# Patient Record
Sex: Female | Born: 1950 | Race: White | Hispanic: No | Smoking: Never smoker
Health system: Southern US, Community
[De-identification: ages and names within clinical notes are randomized; demographics above are authoritative.]

## PROBLEM LIST (undated history)

## (undated) DIAGNOSIS — H35033 Hypertensive retinopathy, bilateral: Secondary | ICD-10-CM

## (undated) DIAGNOSIS — S065X9A Traumatic subdural hemorrhage with loss of consciousness of unspecified duration, initial encounter: Secondary | ICD-10-CM

## (undated) DIAGNOSIS — G4733 Obstructive sleep apnea (adult) (pediatric): Secondary | ICD-10-CM

## (undated) DIAGNOSIS — M1710 Unilateral primary osteoarthritis, unspecified knee: Secondary | ICD-10-CM

## (undated) DIAGNOSIS — I10 Essential (primary) hypertension: Secondary | ICD-10-CM

## (undated) DIAGNOSIS — H524 Presbyopia: Secondary | ICD-10-CM

## (undated) DIAGNOSIS — H43811 Vitreous degeneration, right eye: Secondary | ICD-10-CM

## (undated) DIAGNOSIS — R609 Edema, unspecified: Secondary | ICD-10-CM

## (undated) DIAGNOSIS — S065XAA Traumatic subdural hemorrhage with loss of consciousness status unknown, initial encounter: Secondary | ICD-10-CM

## (undated) DIAGNOSIS — M170 Bilateral primary osteoarthritis of knee: Secondary | ICD-10-CM

## (undated) DIAGNOSIS — H5213 Myopia, bilateral: Secondary | ICD-10-CM

## (undated) DIAGNOSIS — H25019 Cortical age-related cataract, unspecified eye: Secondary | ICD-10-CM

## (undated) DIAGNOSIS — H33109 Unspecified retinoschisis, unspecified eye: Secondary | ICD-10-CM

## (undated) HISTORY — DX: Unilateral primary osteoarthritis, unspecified knee: M17.10

## (undated) HISTORY — DX: Presbyopia: H52.4

## (undated) HISTORY — DX: Hypertensive retinopathy, bilateral: H35.033

## (undated) HISTORY — DX: Myopia, bilateral: H52.13

## (undated) HISTORY — DX: Unspecified retinoschisis, unspecified eye: H33.109

## (undated) HISTORY — DX: Obstructive sleep apnea (adult) (pediatric): G47.33

## (undated) HISTORY — DX: Cortical age-related cataract, unspecified eye: H25.019

## (undated) HISTORY — DX: Vitreous degeneration, right eye: H43.811

## (undated) HISTORY — PX: COMBINED LAPAROSCOPY W/ HYSTEROSCOPY: SUR299

## (undated) HISTORY — PX: OOPHORECTOMY: SHX86

## (undated) HISTORY — PX: HERNIA REPAIR: SHX51

## (undated) HISTORY — DX: Bilateral primary osteoarthritis of knee: M17.0

---

## 2004-10-06 ENCOUNTER — Ambulatory Visit (HOSPITAL_COMMUNITY): Admission: RE | Admit: 2004-10-06 | Discharge: 2004-10-06 | Payer: Self-pay | Admitting: Family Medicine

## 2005-11-10 ENCOUNTER — Ambulatory Visit: Payer: Self-pay | Admitting: Family Medicine

## 2005-11-18 ENCOUNTER — Ambulatory Visit: Payer: Self-pay | Admitting: Family Medicine

## 2006-05-10 ENCOUNTER — Ambulatory Visit: Payer: Self-pay

## 2006-12-08 ENCOUNTER — Ambulatory Visit: Payer: Self-pay

## 2006-12-14 ENCOUNTER — Ambulatory Visit: Payer: Self-pay

## 2007-05-22 ENCOUNTER — Ambulatory Visit: Payer: Self-pay | Admitting: General Surgery

## 2008-02-29 ENCOUNTER — Ambulatory Visit: Payer: Self-pay | Admitting: Urology

## 2008-06-13 ENCOUNTER — Ambulatory Visit: Payer: Self-pay | Admitting: Family Medicine

## 2008-06-19 ENCOUNTER — Ambulatory Visit: Payer: Self-pay | Admitting: Family Medicine

## 2008-09-13 HISTORY — PX: BREAST BIOPSY: SHX20

## 2008-09-13 HISTORY — PX: REPLACEMENT TOTAL KNEE: SUR1224

## 2008-12-25 ENCOUNTER — Ambulatory Visit: Payer: Self-pay

## 2009-01-28 ENCOUNTER — Ambulatory Visit: Payer: Self-pay | Admitting: General Surgery

## 2009-07-08 ENCOUNTER — Ambulatory Visit: Payer: Self-pay | Admitting: Family Medicine

## 2009-09-01 ENCOUNTER — Ambulatory Visit: Payer: Self-pay | Admitting: Ophthalmology

## 2010-07-29 ENCOUNTER — Ambulatory Visit: Payer: Self-pay | Admitting: Family Medicine

## 2013-01-24 DIAGNOSIS — H33109 Unspecified retinoschisis, unspecified eye: Secondary | ICD-10-CM

## 2013-01-24 HISTORY — DX: Unspecified retinoschisis, unspecified eye: H33.109

## 2016-01-19 DIAGNOSIS — M170 Bilateral primary osteoarthritis of knee: Secondary | ICD-10-CM

## 2016-01-19 DIAGNOSIS — Z96652 Presence of left artificial knee joint: Secondary | ICD-10-CM | POA: Insufficient documentation

## 2016-01-19 HISTORY — DX: Bilateral primary osteoarthritis of knee: M17.0

## 2016-02-10 DIAGNOSIS — Z6841 Body Mass Index (BMI) 40.0 and over, adult: Secondary | ICD-10-CM

## 2016-03-01 DIAGNOSIS — G4733 Obstructive sleep apnea (adult) (pediatric): Secondary | ICD-10-CM

## 2016-03-01 HISTORY — DX: Obstructive sleep apnea (adult) (pediatric): G47.33

## 2017-09-26 ENCOUNTER — Other Ambulatory Visit: Payer: Self-pay | Admitting: Family Medicine

## 2017-09-26 DIAGNOSIS — Z1231 Encounter for screening mammogram for malignant neoplasm of breast: Secondary | ICD-10-CM

## 2017-10-17 ENCOUNTER — Ambulatory Visit
Admission: RE | Admit: 2017-10-17 | Discharge: 2017-10-17 | Disposition: A | Discharge status: Home / Self Care | Payer: Medicare Other | Source: Ambulatory Visit | Attending: Family Medicine | Admitting: Family Medicine

## 2017-10-17 DIAGNOSIS — Z1231 Encounter for screening mammogram for malignant neoplasm of breast: Secondary | ICD-10-CM | POA: Insufficient documentation

## 2017-10-21 ENCOUNTER — Inpatient Hospital Stay
Admission: RE | Admit: 2017-10-21 | Discharge: 2017-10-21 | Disposition: A | Discharge status: Home / Self Care | Payer: Self-pay | Source: Ambulatory Visit | Attending: *Deleted | Admitting: *Deleted

## 2017-10-21 ENCOUNTER — Other Ambulatory Visit: Payer: Self-pay | Admitting: *Deleted

## 2017-10-21 DIAGNOSIS — Z9289 Personal history of other medical treatment: Secondary | ICD-10-CM

## 2018-02-01 ENCOUNTER — Ambulatory Visit (INDEPENDENT_AMBULATORY_CARE_PROVIDER_SITE_OTHER): Payer: Medicare Other | Admitting: Urology

## 2018-02-01 ENCOUNTER — Encounter: Payer: Self-pay | Admitting: Urology

## 2018-02-01 VITALS — BP 165/92 | HR 78 | Ht 67.0 in | Wt 276.0 lb

## 2018-02-01 DIAGNOSIS — H43811 Vitreous degeneration, right eye: Secondary | ICD-10-CM

## 2018-02-01 DIAGNOSIS — R310 Gross hematuria: Secondary | ICD-10-CM

## 2018-02-01 DIAGNOSIS — N390 Urinary tract infection, site not specified: Secondary | ICD-10-CM

## 2018-02-01 DIAGNOSIS — M1710 Unilateral primary osteoarthritis, unspecified knee: Secondary | ICD-10-CM

## 2018-02-01 DIAGNOSIS — R351 Nocturia: Secondary | ICD-10-CM | POA: Diagnosis not present

## 2018-02-01 DIAGNOSIS — H25019 Cortical age-related cataract, unspecified eye: Secondary | ICD-10-CM

## 2018-02-01 DIAGNOSIS — H524 Presbyopia: Secondary | ICD-10-CM

## 2018-02-01 DIAGNOSIS — H35033 Hypertensive retinopathy, bilateral: Secondary | ICD-10-CM

## 2018-02-01 DIAGNOSIS — M179 Osteoarthritis of knee, unspecified: Secondary | ICD-10-CM | POA: Insufficient documentation

## 2018-02-01 DIAGNOSIS — H5213 Myopia, bilateral: Secondary | ICD-10-CM

## 2018-02-01 HISTORY — DX: Osteoarthritis of knee, unspecified: M17.9

## 2018-02-01 HISTORY — DX: Unilateral primary osteoarthritis, unspecified knee: M17.10

## 2018-02-01 HISTORY — DX: Myopia, bilateral: H52.13

## 2018-02-01 HISTORY — DX: Hypertensive retinopathy, bilateral: H35.033

## 2018-02-01 HISTORY — DX: Vitreous degeneration, right eye: H43.811

## 2018-02-01 HISTORY — DX: Cortical age-related cataract, unspecified eye: H25.019

## 2018-02-01 HISTORY — DX: Presbyopia: H52.4

## 2018-02-01 LAB — URINALYSIS, COMPLETE
Bilirubin, UA: NEGATIVE
GLUCOSE, UA: NEGATIVE
Ketones, UA: NEGATIVE
NITRITE UA: NEGATIVE
PH UA: 7 (ref 5.0–7.5)
Protein, UA: NEGATIVE
Specific Gravity, UA: 1.02 (ref 1.005–1.030)
UUROB: 0.2 mg/dL (ref 0.2–1.0)

## 2018-02-01 LAB — MICROSCOPIC EXAMINATION

## 2018-02-01 LAB — BLADDER SCAN AMB NON-IMAGING

## 2018-02-01 NOTE — Progress Notes (Signed)
02/01/2018 3:57 PM   Lindsey Calhoun Oct 02, 1950 [id number]  Referring provider: Lebron Quam, MD [address] [place], [place] [address]  Chief Complaint  Patient presents with  . Hematuria    New Patient    HPI: 67 yo F referred for further evaluation of gross hematuria.    She is had 2 episodes of gross hematuria, once in February and once in March.   In February, she was traveling to [facility] to visit a relative.  She developed dysuria and gross hematuria and went to an urgent care for treatment.  She was diagnosed with a UTI and given antibiotics.  Her hematuria resolved along with her dysuria.    She had a second similar presentation in March which was treated by her PCP.  She reports a urine culture was done.  Unfortunately, no records were sent over with her referral today.  She denies any further episodes of gross hematuria.  No flank pain.  She does have remote history of kidney stones.  She had gross hematuria work up in 2004 in [place] underwent cystosocpy.  She had CT scan 2009 for hemturia but cannot recall if she had a cysto at that time.    Never smoker.  She does have a personal history of kidney stones.    Her mother died of bladder cancer.  She denies any baseline urinary symptoms other than nocturia x2-3.  She has been doing Kegel exercises again and this seems to be improving.  She still particularly bothered.   PMH: Past Medical History:  Diagnosis Date  . Bilateral presbyopia 02/01/2018  . Cataract cortical, senile 02/01/2018   Overview:  Bilateral  . Hypertensive retinopathy of both eyes 02/01/2018  . Knee osteoarthritis 02/01/2018  . 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 Moderate obstructive sleep apnea 03/01/2016   Overview:  February 29, 2016 NPSG @ [facility] overall AHI 24.3/hr;  Supine AHI: 11.7/hr; side AHI: 30.7/hr; REM AHI: 54.6/hr.   . Myopia of both eyes 02/01/2018  . Primary osteoarthritis of both knees 01/19/2016  . PVD  (posterior vitreous detachment), right eye 02/01/2018    Surgical History: Past Surgical History:  Procedure Laterality Date  . BREAST BIOPSY Left 2010  . COMBINED LAPAROSCOPY W/ HYSTEROSCOPY    . HERNIA REPAIR    . OOPHORECTOMY    . REPLACEMENT TOTAL KNEE      Home Medications:  Allergies as of 02/01/2018   No Known Allergies     Medication List        Accurate as of 02/01/18  3:57 PM. Always use your most recent med list.          CLARITIN 10 MG Caps Generic drug:  Loratadine Take by mouth.   hydrochlorothiazide 12.5 MG tablet Commonly known as:  HYDRODIURIL Take by mouth.   meloxicam 15 MG tablet Commonly known as:  MOBIC Take 15 mg by mouth daily. with food       Allergies: No Known Allergies  Family History: Family History  Problem Relation Age of Onset  . Bladder Cancer Mother   . Prostate cancer Neg Hx   . Kidney cancer Neg Hx     Social History:  reports that she has never smoked. She has never used smokeless tobacco. She reports that she does not drink alcohol or use drugs.  ROS: UROLOGY Frequent Urination?: No Hard to postpone urination?: Yes Burning/pain with urination?: No Get up at night to urinate?: Yes Leakage of urine?: No Urine stream  starts and stops?: No Trouble starting stream?: No Do you have to strain to urinate?: No Blood in urine?: Yes Urinary tract infection?: No Sexually transmitted disease?: No Injury to kidneys or bladder?: No Painful intercourse?: No Weak stream?: No Currently pregnant?: No Vaginal bleeding?: No Last menstrual period?: n  Gastrointestinal Nausea?: No Vomiting?: No Indigestion/heartburn?: No Diarrhea?: No Constipation?: No  Constitutional Fever: No Night sweats?: No Weight loss?: No Fatigue?: No  Skin Skin rash/lesions?: No Itching?: No  Eyes Blurred vision?: No Double vision?: No  Ears/Nose/Throat Sore throat?: No Sinus problems?: No  Hematologic/Lymphatic Swollen glands?:  No Easy bruising?: No  Cardiovascular Leg swelling?: Yes Chest pain?: No  Respiratory Cough?: No Shortness of breath?: No  Endocrine Excessive thirst?: No  Musculoskeletal Back pain?: No Joint pain?: No  Neurological Headaches?: No Dizziness?: No  Psychologic Depression?: No Anxiety?: No  Physical Exam: BP (!) 165/92   Pulse 78   Ht  (1.702 m)   Wt 276 lb (125.2 kg)   BMI 43.23 kg/m   Constitutional:  Alert and oriented, No acute distress. HEENT: [place] AT, moist mucus membranes.  Trachea midline, no masses. Cardiovascular: No clubbing, cyanosis, or edema. Respiratory: Normal respiratory effort, no increased work of breathing. GI: Abdomen is soft, nontender, nondistended, no abdominal masses GU: No CVA tenderness Skin: No rashes, bruises or suspicious lesions. Neurologic: Grossly intact, no focal deficits, moving all 4 extremities. Psychiatric: Normal mood and affect.  Laboratory Data: N/a  Urinalysis Results for orders placed or performed in visit on 02/01/18  Microscopic Examination  Result Value Ref Range   WBC, UA 6-10 (A) 0 - 5 /hpf   RBC, UA 0-2 0 - 2 /hpf   Epithelial Cells (non renal) 0-10 0 - 10 /hpf   Mucus, UA Present (A) Not Estab.   Bacteria, UA Few (A) None seen/Few  Urinalysis, Complete  Result Value Ref Range   Specific Gravity, UA 1.020 1.005 - 1.030   pH, UA 7.0 5.0 - 7.5   Color, UA Yellow Yellow   Appearance Ur Clear Clear   Leukocytes, UA Trace (A) Negative   Protein, UA Negative Negative/Trace   Glucose, UA Negative Negative   Ketones, UA Negative Negative   RBC, UA Trace (A) Negative   Bilirubin, UA Negative Negative   Urobilinogen, Ur 0.2 0.2 - 1.0 mg/dL   Nitrite, UA Negative Negative   Microscopic Examination See below:   BLADDER SCAN AMB NON-IMAGING  Result Value Ref Range   Scan Result 0ml     Pertinent Imaging: N/a  Assessment & Plan:    1. Gross hematuria And lack of records today, is unclear whether or  not these episodes of gross hematuria are related to underlying urinary tract infection She sent her records today we will visit the need for gross hematuria work-up pending these results No evidence of microscopic blood on urinalysis today which is reassuring We discussed the differential diagnosis for gross hematuria at length and all questions were answered Return to care in 4 weeks today for review - Urinalysis, Complete - BLADDER SCAN AMB NON-IMAGING  2. Nocturia Nocturia which is improving We discussed behavioral modification   Return in about 1 month (around 03/01/2018) for review records (please sign release from PCP), UA.  Vanna Scotland, MD  [facility] [address] [place], [place] [address] [phone]

## 2018-03-28 ENCOUNTER — Ambulatory Visit (INDEPENDENT_AMBULATORY_CARE_PROVIDER_SITE_OTHER): Payer: Medicare Other | Admitting: Urology

## 2018-03-28 ENCOUNTER — Encounter: Payer: Self-pay | Admitting: Urology

## 2018-03-28 VITALS — BP 152/84 | HR 99 | Ht 67.0 in | Wt 270.0 lb

## 2018-03-28 DIAGNOSIS — N3946 Mixed incontinence: Secondary | ICD-10-CM

## 2018-03-28 DIAGNOSIS — N390 Urinary tract infection, site not specified: Secondary | ICD-10-CM

## 2018-03-28 DIAGNOSIS — R310 Gross hematuria: Secondary | ICD-10-CM | POA: Diagnosis not present

## 2018-03-28 LAB — URINALYSIS, COMPLETE
BILIRUBIN UA: NEGATIVE
Glucose, UA: NEGATIVE
KETONES UA: NEGATIVE
LEUKOCYTES UA: NEGATIVE
Nitrite, UA: NEGATIVE
Protein, UA: NEGATIVE
Specific Gravity, UA: 1.02 (ref 1.005–1.030)
Urobilinogen, Ur: 0.2 mg/dL (ref 0.2–1.0)
pH, UA: 6.5 (ref 5.0–7.5)

## 2018-03-28 LAB — MICROSCOPIC EXAMINATION: WBC, UA: NONE SEEN /hpf (ref 0–5)

## 2018-03-28 NOTE — Progress Notes (Signed)
03/28/2018 3:13 PM   Lindsey Calhoun [birth year] [id number]  Referring provider: Lebron Quam, MD [address] [place], [place] [address]  Chief Complaint  Patient presents with  . Hematuria    1 month    HPI: 67 year old female who returns today to review urinary symptoms as well as records.  She had 2 episodes of gross hematuria early in the spring time related to presumed urinary tract infections.  Records from primary care physician were reviewed.  She does have a urinalysis from 11/28/2016 which was grossly positive for infection.  There was large blood as well as large leukocyte esterase.  It was nitrite negative.  Ultimately, the urine culture grew mixed flora.  She was treated with Cipro and her symptoms resolved.  She does have baseline urinary symptoms including mild urinary stress incontinence as well as occasional urge incontinence.  She reports that if she drinks after 6 PM, she often has to get up in the middle the night and sometimes does not make to the bathroom in time.  She also has episodes of urge incontinence when she is going on long car trips and does not stop in time.  She tries to avoid irritating beverages.  She does now use probiotics as well as occasionally cranberry tablets.  She tries to drink plenty of water.  She noticed that when she lost 40 pounds last year, she had improvement of her symptoms but she is regaining this due to stress from her husband's recent triple bypass surgery.  PMH: Past Medical History:  Diagnosis Date  . Bilateral presbyopia 02/01/2018  . Cataract cortical, senile 02/01/2018   Overview:  Bilateral  . Hypertensive retinopathy of both eyes 02/01/2018  . Knee osteoarthritis 02/01/2018  . 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 Moderate obstructive sleep apnea 03/01/2016   Overview:  February 29, 2016 NPSG @ [facility] overall AHI 24.3/hr;  Supine AHI: 11.7/hr; side AHI: 30.7/hr; REM AHI: 54.6/hr.   . Myopia of both eyes  02/01/2018  . Primary osteoarthritis of both knees 01/19/2016  . PVD (posterior vitreous detachment), right eye 02/01/2018    Surgical History: Past Surgical History:  Procedure Laterality Date  . BREAST BIOPSY Left 2010  . COMBINED LAPAROSCOPY W/ HYSTEROSCOPY    . HERNIA REPAIR    . OOPHORECTOMY    . REPLACEMENT TOTAL KNEE      Home Medications:  Allergies as of 03/28/2018   No Known Allergies     Medication List        Accurate as of 03/28/18 11:59 PM. Always use your most recent med list.          CLARITIN 10 MG Caps Generic drug:  Loratadine Take by mouth.   hydrochlorothiazide 12.5 MG tablet Commonly known as:  HYDRODIURIL Take by mouth.   meloxicam 15 MG tablet Commonly known as:  MOBIC Take 15 mg by mouth daily. with food       Allergies: No Known Allergies  Family History: Family History  Problem Relation Age of Onset  . Bladder Cancer Mother   . Prostate cancer Neg Hx   . Kidney cancer Neg Hx     Social History:  reports that she has never smoked. She has never used smokeless tobacco. She reports that she does not drink alcohol or use drugs.  ROS: UROLOGY Frequent Urination?: No Hard to postpone urination?: Yes Burning/pain with urination?: No Get up at night to urinate?: Yes Leakage of urine?: Yes Urine stream starts and  stops?: No Trouble starting stream?: No Do you have to strain to urinate?: No Blood in urine?: No Urinary tract infection?: No Sexually transmitted disease?: No Injury to kidneys or bladder?: No Painful intercourse?: No Weak stream?: No Currently pregnant?: No Vaginal bleeding?: No Last menstrual period?: n  Gastrointestinal Nausea?: No Vomiting?: No Indigestion/heartburn?: No Diarrhea?: No Constipation?: No  Constitutional Fever: No Night sweats?: No Weight loss?: No Fatigue?: No  Skin Skin rash/lesions?: No Itching?: No  Eyes Blurred vision?: No Double vision?: No  Ears/Nose/Throat Sore throat?:  No Sinus problems?: No  Hematologic/Lymphatic Swollen glands?: Yes Easy bruising?: No  Cardiovascular Leg swelling?: No Chest pain?: No  Respiratory Cough?: No Shortness of breath?: No  Endocrine Excessive thirst?: No  Musculoskeletal Back pain?: No Joint pain?: Yes  Neurological Headaches?: No Dizziness?: No  Psychologic Depression?: No Anxiety?: No  Physical Exam: BP (!) 152/84   Pulse 99   Ht 5\' 7"  (1.702 m)   Wt 270 lb (122.5 kg)   BMI 42.29 kg/m   Constitutional:  Alert and oriented, No acute distress. HEENT:  AT, moist mucus membranes.  Trachea midline, no masses. Cardiovascular: No clubbing, cyanosis, or edema. Respiratory: Normal respiratory effort, no increased work of breathing. Skin: No rashes, bruises or suspicious lesions. Neurologic: Grossly intact, no focal deficits, moving all 4 extremities. Psychiatric: Normal mood and affect.  Laboratory Data: N/a  Urinalysis Results for orders placed or performed in visit on 03/28/18  Microscopic Examination  Result Value Ref Range   WBC, UA None seen 0 - 5 /hpf   RBC, UA 0-2 0 - 2 /hpf   Epithelial Cells (non renal) 0-10 0 - 10 /hpf   Mucus, UA Present (A) Not Estab.   Bacteria, UA Moderate (A) None seen/Few  Urinalysis, Complete  Result Value Ref Range   Specific Gravity, UA 1.020 1.005 - 1.030   pH, UA 6.5 5.0 - 7.5   Color, UA Yellow Yellow   Appearance Ur Clear Clear   Leukocytes, UA Negative Negative   Protein, UA Negative Negative/Trace   Glucose, UA Negative Negative   Ketones, UA Negative Negative   RBC, UA Trace (A) Negative   Bilirubin, UA Negative Negative   Urobilinogen, Ur 0.2 0.2 - 1.0 mg/dL   Nitrite, UA Negative Negative   Microscopic Examination See below:     Pertinent Imaging: N/a  Assessment & Plan:    1. Gross hematuria Extensive review of records today, this seems to be associated with urinary tract rather than spontaneous unprovoked microscopic hematuria UA  today is reassuring, no evidence of microscopic blood As such, will defer microscopic/gross hematuria work-up at this time - Urinalysis, Complete  2. Recurrent UTI UTI x2 earlier this year She is currently on a probiotic Recommend increasing cranberry tabs to twice daily We discussed postmenopausal woman could benefit from topical estrogen cream, if she continues to have recurrent infections, will add this to her regimen Hygiene techniques were also reviewed today She will come to our office for UA/urine culture if she has signs or symptoms of UTI   3. Mixed stress and urge urinary incontinence Mixed stress and urge Behavioral modification was discussed today primarily for urge Encourage pelvic floor exercises for stress component She recognizes that her recent weight gain has exacerbated her urinary symptoms and she was doing much better when she weighed 40 pounds last She worked diligently to try to lose weight as this is been most effective in the past We will defer surgical or pharmacotherapy intervention  at this time in lieu of above   Return if symptoms worsen or fail to improve.  Vanna ScotlandAshley Simone Rodenbeck, MD  [facility] [address], [address] [place], [place] [address] [phone]

## 2018-09-18 ENCOUNTER — Other Ambulatory Visit: Payer: Self-pay | Admitting: Family Medicine

## 2018-09-18 DIAGNOSIS — Z1231 Encounter for screening mammogram for malignant neoplasm of breast: Secondary | ICD-10-CM

## 2018-09-20 ENCOUNTER — Encounter: Payer: Self-pay | Admitting: *Deleted

## 2018-09-27 ENCOUNTER — Encounter: Payer: Self-pay | Admitting: Anesthesiology

## 2018-09-28 ENCOUNTER — Ambulatory Visit: Payer: Medicare Other | Admitting: Anesthesiology

## 2018-09-28 ENCOUNTER — Encounter
Admission: RE | Disposition: A | Discharge status: Home / Self Care | Payer: Self-pay | Source: Home / Self Care | Attending: Ophthalmology

## 2018-09-28 ENCOUNTER — Ambulatory Visit
Admission: RE | Admit: 2018-09-28 | Discharge: 2018-09-28 | Disposition: A | Discharge status: Home / Self Care | Payer: Medicare Other | Attending: Ophthalmology | Admitting: Ophthalmology

## 2018-09-28 ENCOUNTER — Encounter: Payer: Self-pay | Admitting: *Deleted

## 2018-09-28 DIAGNOSIS — Z86718 Personal history of other venous thrombosis and embolism: Secondary | ICD-10-CM | POA: Insufficient documentation

## 2018-09-28 DIAGNOSIS — H2512 Age-related nuclear cataract, left eye: Secondary | ICD-10-CM | POA: Diagnosis present

## 2018-09-28 DIAGNOSIS — Z79899 Other long term (current) drug therapy: Secondary | ICD-10-CM | POA: Insufficient documentation

## 2018-09-28 DIAGNOSIS — I10 Essential (primary) hypertension: Secondary | ICD-10-CM | POA: Insufficient documentation

## 2018-09-28 DIAGNOSIS — Z6841 Body Mass Index (BMI) 40.0 and over, adult: Secondary | ICD-10-CM | POA: Diagnosis not present

## 2018-09-28 HISTORY — DX: Traumatic subdural hemorrhage with loss of consciousness status unknown, initial encounter: S06.5XAA

## 2018-09-28 HISTORY — DX: Traumatic subdural hemorrhage with loss of consciousness of unspecified duration, initial encounter: S06.5X9A

## 2018-09-28 HISTORY — DX: Essential (primary) hypertension: I10

## 2018-09-28 HISTORY — DX: Edema, unspecified: R60.9

## 2018-09-28 HISTORY — PX: CATARACT EXTRACTION W/PHACO: SHX586

## 2018-09-28 SURGERY — PHACOEMULSIFICATION, CATARACT, WITH IOL INSERTION
Anesthesia: Monitor Anesthesia Care | Site: Eye | Laterality: Left

## 2018-09-28 MED ORDER — POVIDONE-IODINE 5 % OP SOLN
OPHTHALMIC | Status: DC | PRN
  Administered 2018-09-28 (×2): 1 via OPHTHALMIC

## 2018-09-28 MED ORDER — TETRACAINE HCL 0.5 % OP SOLN
1.0000 [drp] | OPHTHALMIC | Status: AC | PRN
  Administered 2018-09-28 (×3): 1 [drp] via OPHTHALMIC

## 2018-09-28 MED ORDER — EPINEPHRINE PF 1 MG/ML IJ SOLN
INTRAMUSCULAR | Status: AC
  Filled 2018-09-28: qty 2

## 2018-09-28 MED ORDER — MIDAZOLAM HCL 2 MG/2ML IJ SOLN
INTRAMUSCULAR | Status: DC | PRN
  Administered 2018-09-28: 2 mg via INTRAVENOUS

## 2018-09-28 MED ORDER — TRYPAN BLUE 0.06 % OP SOLN
OPHTHALMIC | Status: AC
  Filled 2018-09-28: qty 0.5

## 2018-09-28 MED ORDER — SODIUM HYALURONATE 23 MG/ML IO SOLN
INTRAOCULAR | Status: DC | PRN
  Administered 2018-09-28: 0.6 mL via INTRAOCULAR

## 2018-09-28 MED ORDER — FENTANYL CITRATE (PF) 100 MCG/2ML IJ SOLN
INTRAMUSCULAR | Status: DC | PRN
  Administered 2018-09-28 (×4): 25 ug via INTRAVENOUS

## 2018-09-28 MED ORDER — POVIDONE-IODINE 5 % OP SOLN
OPHTHALMIC | Status: AC
  Filled 2018-09-28: qty 60

## 2018-09-28 MED ORDER — MIDAZOLAM HCL 2 MG/2ML IJ SOLN
INTRAMUSCULAR | Status: AC
  Filled 2018-09-28: qty 2

## 2018-09-28 MED ORDER — LIDOCAINE HCL (PF) 4 % IJ SOLN
INTRAMUSCULAR | Status: AC
  Filled 2018-09-28: qty 5

## 2018-09-28 MED ORDER — SODIUM CHLORIDE 0.9 % IV SOLN
INTRAVENOUS | Status: DC
  Administered 2018-09-28: 07:00:00 via INTRAVENOUS

## 2018-09-28 MED ORDER — EPINEPHRINE PF 1 MG/ML IJ SOLN
INTRAOCULAR | Status: DC | PRN
  Administered 2018-09-28: 09:00:00 via OPHTHALMIC

## 2018-09-28 MED ORDER — MOXIFLOXACIN HCL 0.5 % OP SOLN
OPHTHALMIC | Status: DC | PRN
  Administered 2018-09-28: 0.2 mL via OPHTHALMIC

## 2018-09-28 MED ORDER — FENTANYL CITRATE (PF) 100 MCG/2ML IJ SOLN
INTRAMUSCULAR | Status: AC
  Filled 2018-09-28: qty 2

## 2018-09-28 MED ORDER — ARMC OPHTHALMIC DILATING DROPS
OPHTHALMIC | Status: AC
  Filled 2018-09-28: qty 0.5

## 2018-09-28 MED ORDER — TRYPAN BLUE 0.06 % OP SOLN
OPHTHALMIC | Status: DC | PRN
  Administered 2018-09-28: 0.5 mL via INTRAOCULAR

## 2018-09-28 MED ORDER — TETRACAINE HCL 0.5 % OP SOLN
OPHTHALMIC | Status: AC
  Filled 2018-09-28: qty 4

## 2018-09-28 MED ORDER — CARBACHOL 0.01 % IO SOLN
INTRAOCULAR | Status: DC | PRN
  Administered 2018-09-28: 0.5 mL via INTRAOCULAR

## 2018-09-28 MED ORDER — LIDOCAINE HCL (PF) 4 % IJ SOLN
INTRAOCULAR | Status: DC | PRN
  Administered 2018-09-28: 4 mL via OPHTHALMIC

## 2018-09-28 MED ORDER — MOXIFLOXACIN HCL 0.5 % OP SOLN
OPHTHALMIC | Status: AC
  Filled 2018-09-28: qty 3

## 2018-09-28 MED ORDER — MOXIFLOXACIN HCL 0.5 % OP SOLN: 1.0000 [drp] | OPHTHALMIC | Status: DC | PRN

## 2018-09-28 MED ORDER — NA HYALUR & NA CHOND-NA HYALUR 0.55-0.5 ML IO KIT
PACK | INTRAOCULAR | Status: AC
  Filled 2018-09-28: qty 1.05

## 2018-09-28 MED ORDER — ARMC OPHTHALMIC DILATING DROPS
1.0000 "application " | OPHTHALMIC | Status: AC
  Administered 2018-09-28 (×3): 1 via OPHTHALMIC

## 2018-09-28 MED ORDER — SODIUM HYALURONATE 23 MG/ML IO SOLN
INTRAOCULAR | Status: AC
  Filled 2018-09-28: qty 0.6

## 2018-09-28 SURGICAL SUPPLY — 18 items
DISSECTOR HYDRO NUCLEUS 50X22 (MISCELLANEOUS) ×8 IMPLANT
GLOVE BIOGEL M 6.5 STRL (GLOVE) ×2 IMPLANT
GOWN STRL REUS W/ TWL LRG LVL3 (GOWN DISPOSABLE) ×1 IMPLANT
GOWN STRL REUS W/ TWL XL LVL3 (GOWN DISPOSABLE) ×1 IMPLANT
GOWN STRL REUS W/TWL LRG LVL3 (GOWN DISPOSABLE) ×2
GOWN STRL REUS W/TWL XL LVL3 (GOWN DISPOSABLE) ×2
KNIFE 45D UP 2.3 (MISCELLANEOUS) ×2 IMPLANT
LABEL CATARACT MEDS ST (LABEL) ×2 IMPLANT
LENS IOL ACRSF IQ ULTRA 17.0 (Intraocular Lens) IMPLANT
LENS IOL ACRYSOF IQ 17.0 (Intraocular Lens) ×2 IMPLANT
PACK CATARACT (MISCELLANEOUS) ×2 IMPLANT
PACK CATARACT KING (MISCELLANEOUS) ×2 IMPLANT
PACK EYE AFTER SURG (MISCELLANEOUS) ×2 IMPLANT
SOL BSS BAG (MISCELLANEOUS) ×2
SOLUTION BSS BAG (MISCELLANEOUS) ×1 IMPLANT
SYR 5ML LL (SYRINGE) ×2 IMPLANT
WATER STERILE IRR 250ML POUR (IV SOLUTION) ×2 IMPLANT
WIPE NON LINTING 3.25X3.25 (MISCELLANEOUS) ×2 IMPLANT

## 2018-09-28 NOTE — Discharge Instructions (Signed)
Eye Surgery Discharge Instructions    Expect mild scratchy sensation or mild soreness. DO NOT RUB YOUR EYE!  The day of surgery:  Minimal physical activity, but bed rest is not required  No reading, computer work, or close hand work  No bending, lifting, or straining.  May watch TV  For 24 hours:  No driving, legal decisions, or alcoholic beverages  Safety precautions  Eat anything you prefer: It is better to start with liquids, then soup then solid foods.  _____ Eye patch should be worn until postoperative exam tomorrow.  ____ Solar shield eyeglasses should be worn for comfort in the sunlight/patch while sleeping  Resume all regular medications including aspirin or Coumadin if these were discontinued prior to surgery. You may shower, bathe, shave, or wash your hair. Tylenol may be taken for mild discomfort.  Call your doctor if you experience significant pain, nausea, or vomiting, fever > 101 or other signs of infection. [phone] or [phone] Specific instructions:  Follow-up Information    Elliot Cousin, MD Follow up on 09/29/2018.   Specialty:  Ophthalmology Why:  at [employer] [facility] information: [address] [place] [address] [phone]

## 2018-09-28 NOTE — Anesthesia Procedure Notes (Signed)
Date/Time: 09/28/2018 9:03 AM Performed by: Riccardo Dubin, CRNA Pre-anesthesia Checklist: Patient identified, Emergency Drugs available, Suction available, Patient being monitored and Timeout performed Oxygen Delivery Method: Nasal cannula

## 2018-09-28 NOTE — Anesthesia Preprocedure Evaluation (Signed)
Anesthesia Evaluation  Patient identified by MRN, date of birth, ID band Patient awake    Reviewed: Allergy & Precautions, NPO status , Patient's Chart, lab work & pertinent test results, reviewed documented beta blocker date and time   Airway Mallampati: III  TM Distance: >3 FB     Dental  (+) Chipped   Pulmonary sleep apnea ,           Cardiovascular hypertension, Pt. on medications      Neuro/Psych    GI/Hepatic   Endo/Other  Morbid obesity  Renal/GU      Musculoskeletal  (+) Arthritis ,   Abdominal   Peds  Hematology   Anesthesia Other Findings   Reproductive/Obstetrics                             Anesthesia Physical Anesthesia Plan  ASA: III  Anesthesia Plan: MAC   Post-op Pain Management:    Induction:   PONV Risk Score and Plan:   Airway Management Planned:   Additional Equipment:   Intra-op Plan:   Post-operative Plan:   Informed Consent: I have reviewed the patients History and Physical, chart, labs and discussed the procedure including the risks, benefits and alternatives for the proposed anesthesia with the patient or authorized representative who has indicated his/her understanding and acceptance.       Plan Discussed with: CRNA  Anesthesia Plan Comments:         Anesthesia Quick Evaluation

## 2018-09-28 NOTE — Op Note (Signed)
  PREOPERATIVE DIAGNOSIS:  Nuclear sclerotic cataract of the LEFT eye.   POSTOPERATIVE DIAGNOSIS:  Nuclear sclerotic cataract of the LEFT eye.   OPERATIVE PROCEDURE: Cataract surgery OS   SURGEON:  Elliot CousinBrian Malayia Spizzirri, MD.   ANESTHESIA:  Anesthesiologist: Berdine Addisonhomas, Mathai, MD CRNA: Riccardo DubinMills, Cecelia W, CRNA; Junious SilkNoles, Mark, CRNA  1.      Managed anesthesia care. 2.     0.591ml of Shugarcaine was instilled following the paracentesis   COMPLICATIONS:  None.   TECHNIQUE:   Divide and conquer   DESCRIPTION OF PROCEDURE:  The patient was examined and consented in the preoperative holding area where the aforementioned topical anesthesia was applied to the LEFT eye and then brought back to the Operating Room where the left eye was prepped and draped in the usual sterile ophthalmic fashion and a lid speculum was placed. A paracentesis was created with the side port blade, the anterior chamber was washed out with trypan blue to stain the anterior capsule, and the anterior chamber was filled with viscoelastic. A near clear corneal incision was performed with the steel keratome. A continuous curvilinear capsulorrhexis was performed with a cystotome followed by the capsulorrhexis forceps. Hydrodissection and hydrodelineation were carried out with BSS on a blunt cannula. The lens was removed in a divide and conquer  technique and the remaining cortical material was removed with the irrigation-aspiration handpiece. The capsular bag was inflated with viscoelastic and the lens was placed in the capsular bag without complication. The remaining viscoelastic was removed from the eye with the irrigation-aspiration handpiece. The wounds were hydrated. The anterior chamber was flushed and the eye was inflated to physiologic pressure. 0.311ml Vigamox was placed in the anterior chamber. The wounds were found to be water tight. The eye was dressed with Vigamox. The patient was given protective glasses to wear throughout the day and a  shield with which to sleep tonight. The patient was also given drops with which to begin a drop regimen today and will follow-up with me in one day. Implant Name Type Inv. Item Serial No. Manufacturer Lot No. LRB No. Used  LENS IOL ACRYSOF IQ 17.0 - [id number] 004 Intraocular Lens LENS IOL ACRYSOF IQ 17.0 [id number] 004 ALCON  Left 1    Procedure(s) with comments: CATARACT EXTRACTION PHACO AND INTRAOCULAR LENS PLACEMENT (IOC) LEFT (Left) - US 00:35.0 CDE 4.23 Fluid Pack Lot # [id number] H  Electronically signed: Elliot CousinBRIAN  Aseel Uhde 09/28/2018 9:23 AM

## 2018-09-28 NOTE — Anesthesia Postprocedure Evaluation (Signed)
Anesthesia Post Note  Patient: Lindsey Calhoun  Procedure(s) Performed: CATARACT EXTRACTION PHACO AND INTRAOCULAR LENS PLACEMENT ([place]) LEFT (Left Eye)  Patient location during evaluation: PACU Anesthesia Type: MAC Level of consciousness: awake, awake and alert and oriented Pain management: pain level controlled Vital Signs Assessment: post-procedure vital signs reviewed and stable Respiratory status: spontaneous breathing Cardiovascular status: blood pressure returned to baseline Postop Assessment: no headache Anesthetic complications: no     Last Vitals:  Vitals:   09/28/18 0706 09/28/18 0926  BP: (!) 142/62 129/66  Pulse: 85 76  Resp: 14 16  Temp: 36.6 C (!) 36.1 C  SpO2: 97% 97%    Last Pain:  Vitals:   09/28/18 0926  TempSrc: Temporal  PainSc: 0-No pain                 Angel Hobdy Rayetta Humphrey

## 2018-09-28 NOTE — H&P (Signed)
   I have reviewed the patient's H&P and agree with its findings. There have been no interval changes.  Kailey Esquilin MD Ophthalmology 

## 2018-09-28 NOTE — Transfer of Care (Signed)
Immediate Anesthesia Transfer of Care Note  Patient: Lindsey Calhoun  Procedure(s) Performed: CATARACT EXTRACTION PHACO AND INTRAOCULAR LENS PLACEMENT (IOC) LEFT (Left Eye)  Patient Location: PACU  Anesthesia Type:MAC  Level of Consciousness: awake, alert  and oriented  Airway & Oxygen Therapy: Patient Spontanous Breathing  Post-op Assessment: Report given to RN and Post -op Vital signs reviewed and stable  Post vital signs: Reviewed and stable  Last Vitals:  Vitals Value Taken Time  BP 129/66 09/28/2018  9:26 AM  Temp 36.1 C 09/28/2018  9:26 AM  Pulse 76 09/28/2018  9:26 AM  Resp 16 09/28/2018  9:26 AM  SpO2 97 % 09/28/2018  9:26 AM    Last Pain:  Vitals:   09/28/18 0926  TempSrc: Temporal  PainSc: 0-No pain         Complications: No apparent anesthesia complications

## 2018-09-28 NOTE — Anesthesia Post-op Follow-up Note (Signed)
Anesthesia QCDR form completed.        

## 2018-09-29 ENCOUNTER — Encounter: Payer: Self-pay | Admitting: Ophthalmology

## 2018-10-19 ENCOUNTER — Ambulatory Visit: Payer: Medicare Other | Admitting: General Surgery

## 2018-10-19 ENCOUNTER — Encounter: Payer: Self-pay | Admitting: *Deleted

## 2018-10-31 ENCOUNTER — Encounter: Payer: Self-pay | Admitting: General Surgery

## 2018-10-31 ENCOUNTER — Ambulatory Visit
Admission: RE | Admit: 2018-10-31 | Discharge: 2018-10-31 | Disposition: A | Discharge status: Home / Self Care | Payer: Medicare Other | Source: Ambulatory Visit | Attending: Family Medicine | Admitting: Family Medicine

## 2018-10-31 DIAGNOSIS — Z1231 Encounter for screening mammogram for malignant neoplasm of breast: Secondary | ICD-10-CM | POA: Diagnosis present

## 2018-11-02 ENCOUNTER — Encounter: Payer: Self-pay | Admitting: Emergency Medicine

## 2018-11-02 ENCOUNTER — Ambulatory Visit: Payer: Medicare Other | Admitting: Anesthesiology

## 2018-11-02 ENCOUNTER — Other Ambulatory Visit: Payer: Self-pay

## 2018-11-02 ENCOUNTER — Ambulatory Visit
Admission: RE | Admit: 2018-11-02 | Discharge: 2018-11-02 | Disposition: A | Discharge status: Home / Self Care | Payer: Medicare Other | Attending: Ophthalmology | Admitting: Ophthalmology

## 2018-11-02 ENCOUNTER — Encounter
Admission: RE | Disposition: A | Discharge status: Home / Self Care | Payer: Self-pay | Source: Home / Self Care | Attending: Ophthalmology

## 2018-11-02 DIAGNOSIS — H2511 Age-related nuclear cataract, right eye: Secondary | ICD-10-CM | POA: Insufficient documentation

## 2018-11-02 DIAGNOSIS — R030 Elevated blood-pressure reading, without diagnosis of hypertension: Secondary | ICD-10-CM | POA: Insufficient documentation

## 2018-11-02 DIAGNOSIS — Z96652 Presence of left artificial knee joint: Secondary | ICD-10-CM | POA: Diagnosis not present

## 2018-11-02 DIAGNOSIS — Z79899 Other long term (current) drug therapy: Secondary | ICD-10-CM | POA: Diagnosis not present

## 2018-11-02 DIAGNOSIS — Z885 Allergy status to narcotic agent status: Secondary | ICD-10-CM | POA: Insufficient documentation

## 2018-11-02 HISTORY — PX: CATARACT EXTRACTION W/PHACO: SHX586

## 2018-11-02 SURGERY — PHACOEMULSIFICATION, CATARACT, WITH IOL INSERTION
Anesthesia: Monitor Anesthesia Care | Site: Eye | Laterality: Right

## 2018-11-02 MED ORDER — NA CHONDROIT SULF-NA HYALURON 40-17 MG/ML IO SOLN
INTRAOCULAR | Status: DC | PRN
  Administered 2018-11-02: 1 mL via INTRAOCULAR

## 2018-11-02 MED ORDER — TRYPAN BLUE 0.06 % OP SOLN
OPHTHALMIC | Status: AC
  Filled 2018-11-02: qty 0.5

## 2018-11-02 MED ORDER — CARBACHOL 0.01 % IO SOLN
INTRAOCULAR | Status: DC | PRN
  Administered 2018-11-02: .5 mL via INTRAOCULAR

## 2018-11-02 MED ORDER — MOXIFLOXACIN HCL 0.5 % OP SOLN
OPHTHALMIC | Status: AC
  Filled 2018-11-02: qty 3

## 2018-11-02 MED ORDER — MIDAZOLAM HCL 2 MG/2ML IJ SOLN
INTRAMUSCULAR | Status: AC
  Filled 2018-11-02: qty 2

## 2018-11-02 MED ORDER — NA HYALUR & NA CHOND-NA HYALUR 0.55-0.5 ML IO KIT
PACK | INTRAOCULAR | Status: DC | PRN
  Administered 2018-11-02: 1 via OPHTHALMIC

## 2018-11-02 MED ORDER — ARMC OPHTHALMIC DILATING DROPS
1.0000 "application " | OPHTHALMIC | Status: AC
  Administered 2018-11-02 (×3): 1 via OPHTHALMIC

## 2018-11-02 MED ORDER — MOXIFLOXACIN HCL 0.5 % OP SOLN
OPHTHALMIC | Status: DC | PRN
  Administered 2018-11-02: .2 mL via OPHTHALMIC

## 2018-11-02 MED ORDER — ONDANSETRON HCL 4 MG/2ML IJ SOLN
INTRAMUSCULAR | Status: DC | PRN
  Administered 2018-11-02: 4 mg via INTRAVENOUS

## 2018-11-02 MED ORDER — TETRACAINE HCL 0.5 % OP SOLN
OPHTHALMIC | Status: AC
  Administered 2018-11-02: 1 [drp] via OPHTHALMIC
  Filled 2018-11-02: qty 4

## 2018-11-02 MED ORDER — ONDANSETRON HCL 4 MG/2ML IJ SOLN
INTRAMUSCULAR | Status: AC
  Filled 2018-11-02: qty 2

## 2018-11-02 MED ORDER — FENTANYL CITRATE (PF) 100 MCG/2ML IJ SOLN
INTRAMUSCULAR | Status: DC | PRN
  Administered 2018-11-02 (×4): 25 ug via INTRAVENOUS

## 2018-11-02 MED ORDER — TRYPAN BLUE 0.06 % OP SOLN
OPHTHALMIC | Status: DC | PRN
  Administered 2018-11-02: .5 mL via INTRAOCULAR

## 2018-11-02 MED ORDER — EPINEPHRINE PF 1 MG/ML IJ SOLN
INTRAMUSCULAR | Status: AC
  Filled 2018-11-02: qty 1

## 2018-11-02 MED ORDER — NA CHONDROIT SULF-NA HYALURON 40-17 MG/ML IO SOLN
INTRAOCULAR | Status: AC
  Filled 2018-11-02: qty 1

## 2018-11-02 MED ORDER — POVIDONE-IODINE 5 % OP SOLN
OPHTHALMIC | Status: DC | PRN
  Administered 2018-11-02: 2 via OPHTHALMIC

## 2018-11-02 MED ORDER — MOXIFLOXACIN HCL 0.5 % OP SOLN: 1.0000 [drp] | OPHTHALMIC | Status: DC | PRN

## 2018-11-02 MED ORDER — POVIDONE-IODINE 5 % OP SOLN
OPHTHALMIC | Status: AC
  Filled 2018-11-02: qty 60

## 2018-11-02 MED ORDER — SODIUM CHLORIDE 0.9 % IV SOLN
INTRAVENOUS | Status: DC
  Administered 2018-11-02: 06:00:00 via INTRAVENOUS

## 2018-11-02 MED ORDER — LIDOCAINE HCL (PF) 4 % IJ SOLN
INTRAMUSCULAR | Status: AC
  Filled 2018-11-02: qty 5

## 2018-11-02 MED ORDER — NA HYALUR & NA CHOND-NA HYALUR 0.55-0.5 ML IO KIT
PACK | INTRAOCULAR | Status: AC
  Filled 2018-11-02: qty 1.05

## 2018-11-02 MED ORDER — LIDOCAINE HCL (PF) 4 % IJ SOLN
INTRAOCULAR | Status: DC | PRN
  Administered 2018-11-02: 2 mL via OPHTHALMIC

## 2018-11-02 MED ORDER — ARMC OPHTHALMIC DILATING DROPS
OPHTHALMIC | Status: AC
  Administered 2018-11-02: 1 via OPHTHALMIC
  Filled 2018-11-02: qty 0.5

## 2018-11-02 MED ORDER — EPINEPHRINE PF 1 MG/ML IJ SOLN
INTRAOCULAR | Status: DC | PRN
  Administered 2018-11-02: 1 mL via OPHTHALMIC

## 2018-11-02 MED ORDER — MIDAZOLAM HCL 2 MG/2ML IJ SOLN
INTRAMUSCULAR | Status: DC | PRN
  Administered 2018-11-02: 2 mg via INTRAVENOUS

## 2018-11-02 MED ORDER — FENTANYL CITRATE (PF) 100 MCG/2ML IJ SOLN
INTRAMUSCULAR | Status: AC
  Filled 2018-11-02: qty 2

## 2018-11-02 MED ORDER — TETRACAINE HCL 0.5 % OP SOLN
1.0000 [drp] | OPHTHALMIC | Status: AC | PRN
  Administered 2018-11-02 (×3): 1 [drp] via OPHTHALMIC

## 2018-11-02 SURGICAL SUPPLY — 18 items
DISSECTOR HYDRO NUCLEUS 50X22 (MISCELLANEOUS) ×12 IMPLANT
GLOVE BIOGEL M 6.5 STRL (GLOVE) ×3 IMPLANT
GOWN STRL REUS W/ TWL LRG LVL3 (GOWN DISPOSABLE) ×1 IMPLANT
GOWN STRL REUS W/ TWL XL LVL3 (GOWN DISPOSABLE) ×1 IMPLANT
GOWN STRL REUS W/TWL LRG LVL3 (GOWN DISPOSABLE) ×3
GOWN STRL REUS W/TWL XL LVL3 (GOWN DISPOSABLE) ×3
KNIFE 45D UP 2.3 (MISCELLANEOUS) ×3 IMPLANT
LABEL CATARACT MEDS ST (LABEL) ×3 IMPLANT
LENS IOL ACRSF IQ ULTRA 15.5 (Intraocular Lens) IMPLANT
LENS IOL ACRYSOF IQ 15.5 (Intraocular Lens) ×3 IMPLANT
PACK CATARACT (MISCELLANEOUS) ×3 IMPLANT
PACK CATARACT KING (MISCELLANEOUS) ×3 IMPLANT
PACK EYE AFTER SURG (MISCELLANEOUS) ×3 IMPLANT
SOL BSS BAG (MISCELLANEOUS) ×3
SOLUTION BSS BAG (MISCELLANEOUS) ×1 IMPLANT
SYR 5ML LL (SYRINGE) ×3 IMPLANT
WATER STERILE IRR 250ML POUR (IV SOLUTION) ×3 IMPLANT
WIPE NON LINTING 3.25X3.25 (MISCELLANEOUS) ×3 IMPLANT

## 2018-11-02 NOTE — Anesthesia Preprocedure Evaluation (Signed)
Anesthesia Evaluation  Patient identified by MRN, date of birth, ID band Patient awake    Reviewed: Allergy & Precautions, NPO status , Patient's Chart, lab work & pertinent test results  History of Anesthesia Complications Negative for: history of anesthetic complications  Airway Mallampati: III  TM Distance: >3 FB Neck ROM: Full    Dental  (+) Caps   Pulmonary sleep apnea , neg COPD,    breath sounds clear to auscultation- rhonchi (-) wheezing      Cardiovascular hypertension, Pt. on medications (-) CAD, (-) Past MI, (-) Cardiac Stents and (-) CABG  Rhythm:Regular Rate:Normal - Systolic murmurs and - Diastolic murmurs    Neuro/Psych neg Seizures negative neurological ROS  negative psych ROS   GI/Hepatic negative GI ROS, Neg liver ROS,   Endo/Other  negative endocrine ROSneg diabetes  Renal/GU negative Renal ROS     Musculoskeletal  (+) Arthritis ,   Abdominal (+) + obese,   Peds  Hematology negative hematology ROS (+)   Anesthesia Other Findings Past Medical History: 02/01/2018: Bilateral presbyopia 02/01/2018: Cataract cortical, senile     Comment:  Overview:  Bilateral No date: Edema     Comment:  LEFT KNEE AND LEG No date: Edema     Comment:  LEFT LEG/ KNEE No date: Hypertension 02/01/2018: Hypertensive retinopathy of both eyes 02/01/2018: Knee osteoarthritis 01/24/2013: 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  02/01/2018: Myopia of both eyes 01/19/2016: Primary osteoarthritis of both knees 02/01/2018: PVD (posterior vitreous detachment), right eye No date: Subdural hematoma (HCC)     Comment:  H/O   Reproductive/Obstetrics                             Anesthesia  Physical Anesthesia Plan  ASA: III  Anesthesia Plan: MAC   Post-op Pain Management:    Induction: Intravenous  PONV Risk Score and Plan: 2 and Midazolam  Airway Management Planned: Natural Airway  Additional Equipment:   Intra-op Plan:   Post-operative Plan:   Informed Consent: I have reviewed the patients History and Physical, chart, labs and discussed the procedure including the risks, benefits and alternatives for the proposed anesthesia with the patient or authorized representative who has indicated his/her understanding and acceptance.       Plan Discussed with: CRNA and Anesthesiologist  Anesthesia Plan Comments:         Anesthesia Quick Evaluation

## 2018-11-02 NOTE — Transfer of Care (Signed)
Immediate Anesthesia Transfer of Care Note  Patient: Lindsey Calhoun  Procedure(s) Performed: CATARACT EXTRACTION PHACO AND INTRAOCULAR LENS PLACEMENT (IOC) RIGHT (Right Eye)  Patient Location: PACU  Anesthesia Type:MAC  Level of Consciousness: awake  Airway & Oxygen Therapy: Patient Spontanous Breathing  Post-op Assessment: Report given to RN and Post -op Vital signs reviewed and stable  Post vital signs: stable  Last Vitals:  Vitals Value Taken Time  BP    Temp    Pulse    Resp    SpO2      Last Pain:  Vitals:   11/02/18 0613  TempSrc: Oral  PainSc: 1          Complications: No apparent anesthesia complications

## 2018-11-02 NOTE — Discharge Instructions (Addendum)
Eye Surgery Discharge Instructions  Expect mild scratchy sensation or mild soreness. DO NOT RUB YOUR EYE!  The day of surgery:  Minimal physical activity, but bed rest is not required  No reading, computer work, or close hand work  No bending, lifting, or straining.  May watch TV  For 24 hours:  No driving, legal decisions, or alcoholic beverages  Safety precautions  Eat anything you prefer: It is better to start with liquids, then soup then solid foods.  Solar shield eyeglasses should be worn for comfort in the sunlight/patch while sleeping  Resume all regular medications including aspirin or Coumadin if these were discontinued prior to surgery. You may shower, bathe, shave, or wash your hair. Tylenol may be taken for mild discomfort. Follow Dr. Bunnie Pion eye drop instruction sheet as reviewed.  Call your doctor if you experience significant pain, nausea, or vomiting, fever > 101 or other signs of infection. [phone] or [phone] Specific instructions:  Follow-up Information    Elliot Cousin, MD Follow up.   Specialty:  Ophthalmology Why:  11/03/18 @ 10:15 am Contact information: [address] [place] [address] [phone]

## 2018-11-02 NOTE — Op Note (Signed)
  PREOPERATIVE DIAGNOSIS:  Nuclear sclerotic cataract of the RIGHT eye.   POSTOPERATIVE DIAGNOSIS:  Nuclear sclerotic cataract of the RIGHT eye.   OPERATIVE PROCEDURE: Cataract surgery OD   SURGEON:  Elliot Cousin, MD.   ANESTHESIA:  Anesthesiologist: Alver Fisher, MD CRNA: Oletta Lamas, CRNA  1.      Managed anesthesia care. 2.     0.68ml of Shugarcaine was instilled following the paracentesis   COMPLICATIONS:  None.   TECHNIQUE:   Divide and conquer   DESCRIPTION OF PROCEDURE:  The patient was examined and consented in the preoperative holding area where the aforementioned topical anesthesia was applied to the RIGHT eye and then brought back to the Operating Room where the RIGHT eye was prepped and draped in the usual sterile ophthalmic fashion and a lid speculum was placed. A paracentesis was created with the side port blade, the anterior chamber was washed out with trypan blue to stain the anterior capsule, and the anterior chamber was filled with viscoelastic. A near clear corneal incision was performed with the steel keratome. A continuous curvilinear capsulorrhexis was performed with a cystotome followed by the capsulorrhexis forceps. Hydrodissection and hydrodelineation were carried out with BSS on a blunt cannula. The lens was removed in a divide and conquer  technique and the remaining cortical material was removed with the irrigation-aspiration handpiece. The capsular bag was inflated with viscoelastic and the lens was placed in the capsular bag without complication. The remaining viscoelastic was removed from the eye with the irrigation-aspiration handpiece. The wounds were hydrated. The anterior chamber was flushed and the eye was inflated to physiologic pressure. 0.66ml Vigamox was placed in the anterior chamber. The wounds were found to be water tight. The eye was dressed with Vigamox. The patient was given protective glasses to wear throughout the day and a shield with  which to sleep tonight. The patient was also given drops with which to begin a drop regimen today and will follow-up with me in one day. Implant Name Type Inv. Item Serial No. Manufacturer Lot No. LRB No. Used  LENS IOL ACRYSOF IQ 15.5 - [id number] 120 Intraocular Lens LENS IOL ACRYSOF IQ 15.5 [id number] ALCON  Right 1    Procedure(s) with comments: CATARACT EXTRACTION PHACO AND INTRAOCULAR LENS PLACEMENT (IOC) RIGHT (Right) - [place]   01:06 CDE Fluid pack lot # [id number]  Electronically signed: Elliot Cousin 11/02/2018 8:26 AM

## 2018-11-02 NOTE — Anesthesia Postprocedure Evaluation (Signed)
Anesthesia Post Note  Patient: Lindsey Calhoun  Procedure(s) Performed: CATARACT EXTRACTION PHACO AND INTRAOCULAR LENS PLACEMENT ([place]) RIGHT (Right Eye)  Patient location during evaluation: Phase II Anesthesia Type: MAC Level of consciousness: awake Pain management: satisfactory to patient Vital Signs Assessment: post-procedure vital signs reviewed and stable Respiratory status: spontaneous breathing Cardiovascular status: blood pressure returned to baseline and stable Postop Assessment: no apparent nausea or vomiting Anesthetic complications: no     Last Vitals:  Vitals:   11/02/18 0613 11/02/18 0830  BP: 121/68 117/68  Pulse: 75 (!) 59  Resp: 18 18  Temp: 36.8 C (!) 36.1 C  SpO2: 96% 97%    Last Pain:  Vitals:   11/02/18 0830  TempSrc: Temporal  PainSc: [place]

## 2018-11-02 NOTE — Anesthesia Post-op Follow-up Note (Signed)
Anesthesia QCDR form completed.        

## 2018-11-02 NOTE — H&P (Signed)
   I have reviewed the patient's H&P and agree with its findings. There have been no interval changes.  Kawehi Hostetter MD Ophthalmology 

## 2019-02-26 ENCOUNTER — Encounter: Payer: Self-pay | Admitting: General Surgery

## 2019-08-02 ENCOUNTER — Other Ambulatory Visit: Payer: Self-pay

## 2019-08-02 DIAGNOSIS — Z20822 Contact with and (suspected) exposure to covid-19: Secondary | ICD-10-CM

## 2019-08-05 LAB — NOVEL CORONAVIRUS, NAA: SARS-CoV-2, NAA: NOT DETECTED

## 2019-09-27 ENCOUNTER — Other Ambulatory Visit: Payer: Self-pay | Admitting: Family Medicine

## 2019-09-27 DIAGNOSIS — Z1231 Encounter for screening mammogram for malignant neoplasm of breast: Secondary | ICD-10-CM

## 2019-11-02 ENCOUNTER — Ambulatory Visit
Admission: RE | Admit: 2019-11-02 | Discharge: 2019-11-02 | Disposition: A | Discharge status: Home / Self Care | Payer: Medicare Other | Source: Ambulatory Visit | Attending: Family Medicine | Admitting: Family Medicine

## 2019-11-02 DIAGNOSIS — Z1231 Encounter for screening mammogram for malignant neoplasm of breast: Secondary | ICD-10-CM | POA: Diagnosis present

## 2019-11-15 ENCOUNTER — Other Ambulatory Visit: Payer: Self-pay

## 2019-11-15 ENCOUNTER — Ambulatory Visit (INDEPENDENT_AMBULATORY_CARE_PROVIDER_SITE_OTHER): Payer: Medicare Other | Admitting: Cardiology

## 2019-11-15 ENCOUNTER — Encounter: Payer: Self-pay | Admitting: Cardiology

## 2019-11-15 VITALS — BP 122/80 | HR 78 | Ht 66.0 in | Wt 293.2 lb

## 2019-11-15 DIAGNOSIS — R0602 Shortness of breath: Secondary | ICD-10-CM | POA: Diagnosis not present

## 2019-11-15 DIAGNOSIS — R0600 Dyspnea, unspecified: Secondary | ICD-10-CM

## 2019-11-15 DIAGNOSIS — I10 Essential (primary) hypertension: Secondary | ICD-10-CM

## 2019-11-15 DIAGNOSIS — R0609 Other forms of dyspnea: Secondary | ICD-10-CM

## 2019-11-15 NOTE — Patient Instructions (Signed)
Medication Instructions:  Your physician recommends that you continue on your current medications as directed. Please refer to the Current Medication list given to you today.  *If you need a refill on your cardiac medications before your next appointment, please call your pharmacy*   Lab Work: Your physician recommends that you have lab work today(BMET)  If you have labs (blood work) drawn today and your tests are completely normal, you will receive your results only by: Marland Kitchen MyChart Message (if you have MyChart) OR . A paper copy in the mail If you have any lab test that is abnormal or we need to change your treatment, we will call you to review the results.   Testing/Procedures: 1- Echo  Please return to [facility] on ______________ at _______________ AM/PM for an Echocardiogram. Your physician has requested that you have an echocardiogram. Echocardiography is a painless test that uses sound waves to create images of your heart. It provides your doctor with information about the size and shape of your heart and how well your heart's chambers and valves are working. This procedure takes approximately one hour. There are no restrictions for this procedure. Please note; depending on visual quality an IV may need to be placed.   2- [facility] MYOVIEW  Your caregiver has ordered a Stress Test with nuclear imaging. The purpose of this test is to evaluate the blood supply to your heart muscle. This procedure is referred to as a "Non-Invasive Stress Test." This is because other than having an IV started in your vein, nothing is inserted or "invades" your body. Cardiac stress tests are done to find areas of poor blood flow to the heart by determining the extent of coronary artery disease (CAD). Some patients exercise on a treadmill, which naturally increases the blood flow to your heart, while others who are  unable to walk on a treadmill due to physical limitations have a pharmacologic/chemical  stress agent called Lexiscan . This medicine will mimic walking on a treadmill by temporarily increasing your coronary blood flow.   Please note: these test may take anywhere between 2-4 hours to complete  PLEASE REPORT TO [facility] MEDICAL MALL ENTRANCE  THE VOLUNTEERS AT THE FIRST DESK WILL DIRECT YOU WHERE TO GO  Date of Procedure:_____________________________________  Arrival Time for Procedure:______________________________  Instructions regarding medication:   ____ : Hold diabetes medication morning of procedure  __x__:  Hold betablocker(s) night before procedure and morning of procedure (metoprolol)  __x__:  Hold other medications as follows:_____HCTZ the morning of______  PLEASE NOTIFY THE OFFICE AT LEAST 24 HOURS IN ADVANCE IF YOU ARE UNABLE TO KEEP YOUR APPOINTMENT.  [phone] AND  PLEASE NOTIFY NUCLEAR MEDICINE AT [facility] AT LEAST 24 HOURS IN ADVANCE IF YOU ARE UNABLE TO KEEP YOUR APPOINTMENT. [phone]  How to prepare for your Myoview test:  1. Do not eat or drink after midnight 2. No caffeine for 24 hours prior to test 3. No smoking 24 hours prior to test. 4. Your medication may be taken with water.  If your doctor stopped a medication because of this test, do not take that medication. 5. Ladies, please do not wear dresses.  Skirts or pants are appropriate. Please wear a short sleeve shirt. 6. No perfume, cologne or lotion. 7. Wear comfortable walking shoes. No heels!     Follow-Up: At [facility], you and your health needs are our priority.  As part of our continuing mission to provide you with exceptional heart care, we have created designated  Provider Care Teams.  These Care Teams include your primary Cardiologist (physician) and Advanced Practice Providers (APPs -  Physician Assistants and Nurse Practitioners) who all work together to provide you with the care you need, when you need it.  We recommend signing up for the patient portal called "MyChart".  Sign  up information is provided on this After Visit Summary.  MyChart is used to connect with patients for Virtual Visits (Telemedicine).  Patients are able to view lab/test results, encounter notes, upcoming appointments, etc.  Non-urgent messages can be sent to your provider as well.   To learn more about what you can do with MyChart, go to [email or web address].    Your next appointment:   4-6 week(s)  The format for your next appointment:   In Person  Provider:    You may see Kate Sable, MD or one of the following Advanced Practice Providers on your designated Care Team:    Murray Hodgkins, NP  Christell Faith, PA-C  Marrianne Mood, PA-C

## 2019-11-15 NOTE — Progress Notes (Signed)
Cardiology Office Note:    Date:  11/15/2019   ID:  Lindsey Calhoun, DOB Nov 06, 1950, MRN [id number]  PCP:  Lebron Quam, MD  Cardiologist:  Debbe Odea, MD  Electrophysiologist:  None   Referring MD: Lebron Quam, MD   Chief Complaint  Patient presents with  . OTHER    SOB with exertion. Meds reviewed verbally with pt.    History of Present Illness:    Lindsey Calhoun is a 69 y.o. female with a hx of hypertension, sleep apnea who presents due to shortness of breath .  Patient states symptoms of dyspnea started roughly 2 months ago.  Symptoms are associated with exertion.  She also endorses chest pressure whenever she goes up a flight of stairs.  Symptoms are usually resolved with rest.  She denies any history of heart disease.  She states gaining about 30 pounds over the past year due to not doing much secondary to COVID-19 pandemic.  She has a brother who had an MI age 39s.  She denies orthopnea, edema, palpitations.  She has right knee arthritis and right hip pain limiting mobility.  She was diagnosed with hypertension roughly 2 weeks ago systolic blood pressure in the 150s.  Primary care started hydrochlorothiazide 25 mg daily.  Blood pressure has improved since then.  Her symptoms have somewhat improved since controlling her blood pressure.  Past Medical History:  Diagnosis Date  . Bilateral presbyopia 02/01/2018  . Cataract cortical, senile 02/01/2018   Overview:  Bilateral  . Edema    LEFT KNEE AND LEG  . Edema    LEFT LEG/ KNEE  . Hypertension   . Hypertensive retinopathy of both eyes 02/01/2018  . Knee osteoarthritis 02/01/2018  . 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 Moderate obstructive sleep apnea 03/01/2016   Overview:  February 29, 2016 NPSG @ [facility] overall AHI 24.3/hr;  Supine AHI: 11.7/hr; side AHI: 30.7/hr; REM AHI: 54.6/hr.   . Myopia of both eyes 02/01/2018  . Primary osteoarthritis of both knees 01/19/2016  . PVD (posterior vitreous  detachment), right eye 02/01/2018  . Subdural hematoma (HCC)    H/O    Past Surgical History:  Procedure Laterality Date  . BREAST BIOPSY Left 2010  . CATARACT EXTRACTION W/PHACO Left 09/28/2018   Procedure: CATARACT EXTRACTION PHACO AND INTRAOCULAR LENS PLACEMENT (IOC) LEFT;  Surgeon: Elliot Cousin, MD;  Location: [facility] ORS;  Service: Ophthalmology;  Laterality: Left;  [place] 00:35.0 CDE 4.23 Fluid Pack Lot # [id number]  . CATARACT EXTRACTION W/PHACO Right 11/02/2018   Procedure: CATARACT EXTRACTION PHACO AND INTRAOCULAR LENS PLACEMENT (IOC) RIGHT;  Surgeon: Elliot Cousin, MD;  Location: [facility] ORS;  Service: Ophthalmology;  Laterality: Right;  [place]   01:06 CDE Fluid pack lot # [id number]  . COMBINED LAPAROSCOPY W/ HYSTEROSCOPY    . HERNIA REPAIR    . OOPHORECTOMY    . REPLACEMENT TOTAL KNEE Left 2010    Current Medications: Current Meds  Medication Sig  . fexofenadine (ALLEGRA) 180 MG tablet Take 180 mg by mouth daily.  . hydrochlorothiazide (HYDRODIURIL) 25 MG tablet Take 25 mg by mouth daily.   . meclizine (ANTIVERT) 25 MG tablet Take 25 mg by mouth 3 (three) times daily as needed.  . meloxicam (MOBIC) 15 MG tablet Take 15 mg by mouth daily as needed for pain.  . metoprolol succinate (TOPROL-XL) 25 MG 24 hr tablet Take 25 mg by mouth daily.  . Vitamin D, Ergocalciferol, (DRISDOL) 1.25 MG (50000 UNIT) CAPS capsule  Take 50,000 Units by mouth once a week.     Allergies:   Codeine, Oxycontin [oxycodone hcl], and Sulfa antibiotics   Social History   Socioeconomic History  . Marital status: Married    Spouse name: Not on file  . Number of children: Not on file  . Years of education: Not on file  . Highest education level: Not on file  Occupational History  . Not on file  Tobacco Use  . Smoking status: Never Smoker  . Smokeless tobacco: Never Used  Substance and Sexual Activity  . Alcohol use: Not Currently    Comment: OCCAS  . Drug use: Never  . Sexual activity: Not on file  Other  Topics Concern  . Not on file  Social History Narrative  . Not on file   Social Determinants of Health   Financial Resource Strain:   . Difficulty of Paying Living Expenses: Not on file  Food Insecurity:   . Worried About Programme researcher, broadcasting/film/video in the Last Year: Not on file  . Ran Out of Food in the Last Year: Not on file  Transportation Needs:   . Lack of Transportation (Medical): Not on file  . Lack of Transportation (Non-Medical): Not on file  Physical Activity:   . Days of Exercise per Week: Not on file  . Minutes of Exercise per Session: Not on file  Stress:   . Feeling of Stress : Not on file  Social Connections:   . Frequency of Communication with Friends and Family: Not on file  . Frequency of Social Gatherings with Friends and Family: Not on file  . Attends Religious Services: Not on file  . Active Member of Clubs or Organizations: Not on file  . Attends Banker Meetings: Not on file  . Marital Status: Not on file     Family History: The patient's family history includes Bladder Cancer in her mother; Heart failure in her father; Stroke in her father. There is no history of Prostate cancer or Kidney cancer.  ROS:   Please see the history of present illness.     All other systems reviewed and are negative.  EKGs/Labs/Other Studies Reviewed:    The following studies were reviewed today:   EKG:  EKG is  ordered today.  The ekg ordered today demonstrates normal sinus rhythm, occasional PVCs.  Recent Labs: No results found for requested labs within last 8760 hours.  Recent Lipid Panel No results found for: CHOL, TRIG, HDL, CHOLHDL, VLDL, LDLCALC, LDLDIRECT  Physical Exam:    VS:  BP 122/80 (BP Location: Right Arm, Patient Position: Sitting, Cuff Size: Large)   Pulse 78   Ht 5\' 6"  (1.676 m)   Wt 293 lb 4 oz (133 kg)   SpO2 98%   BMI 47.33 kg/m     Wt Readings from Last 3 Encounters:  11/15/19 293 lb 4 oz (133 kg)  11/02/18 260 lb (117.9 kg)    09/28/18 260 lb (117.9 kg)     GEN:  Well nourished, well developed in no acute distress, obese HEENT: Normal NECK: No JVD; No carotid bruits LYMPHATICS: No lymphadenopathy CARDIAC: RRR, no murmurs, rubs, gallops RESPIRATORY:  Clear to auscultation without rales, wheezing or rhonchi  ABDOMEN: Soft, non-tender, non-distended MUSCULOSKELETAL:  No edema; No deformity  SKIN: Warm and dry NEUROLOGIC:  Alert and oriented x 3 PSYCHIATRIC:  Normal affect   ASSESSMENT:    1. Dyspnea on exertion   2. Morbid obesity (HCC)  3. Essential hypertension   4. Shortness of breath    PLAN:    In order of problems listed above:  1. Patient with a 40-month history of dyspnea on exertion and chest tightness.  This could be secondary to obesity/deconditioning or an anginal equivalent.  We will evaluate with an echocardiogram for any structural abnormalities.  Also obtain Lexiscan myocardial perfusion imaging stress test for ischemia. 2. She is morbidly obese.  Low calorie diet/weight loss advised.  Over 5 minutes spent counseling patient. 3. History of hypertension, blood pressure is adequately controlled.  Continue current dose of HCTZ.  Follow-up after echocardiogram and stress test.  This note was generated in part or whole with voice recognition software. Voice recognition is usually quite accurate but there are transcription errors that can and very often do occur. I apologize for any typographical errors that were not detected and corrected.  Medication Adjustments/Labs and Tests Ordered: Current medicines are reviewed at length with the patient today.  Concerns regarding medicines are outlined above.  Orders Placed This Encounter  Procedures  . NM Myocar Multi W/Spect W/Wall Motion / EF  . Basic metabolic panel  . EKG 12-Lead  . ECHOCARDIOGRAM COMPLETE   No orders of the defined types were placed in this encounter.   Patient Instructions  Medication Instructions:  Your physician  recommends that you continue on your current medications as directed. Please refer to the Current Medication list given to you today.  *If you need a refill on your cardiac medications before your next appointment, please call your pharmacy*   Lab Work: Your physician recommends that you have lab work today(BMET)  If you have labs (blood work) drawn today and your tests are completely normal, you will receive your results only by: Marland Kitchen MyChart Message (if you have MyChart) OR . A paper copy in the mail If you have any lab test that is abnormal or we need to change your treatment, we will call you to review the results.   Testing/Procedures: 1- Echo  Please return to [facility] on ______________ at _______________ AM/PM for an Echocardiogram. Your physician has requested that you have an echocardiogram. Echocardiography is a painless test that uses sound waves to create images of your heart. It provides your doctor with information about the size and shape of your heart and how well your heart's chambers and valves are working. This procedure takes approximately one hour. There are no restrictions for this procedure. Please note; depending on visual quality an IV may need to be placed.   2- [facility] MYOVIEW  Your caregiver has ordered a Stress Test with nuclear imaging. The purpose of this test is to evaluate the blood supply to your heart muscle. This procedure is referred to as a "Non-Invasive Stress Test." This is because other than having an IV started in your vein, nothing is inserted or "invades" your body. Cardiac stress tests are done to find areas of poor blood flow to the heart by determining the extent of coronary artery disease (CAD). Some patients exercise on a treadmill, which naturally increases the blood flow to your heart, while others who are  unable to walk on a treadmill due to physical limitations have a pharmacologic/chemical stress agent called Lexiscan . This  medicine will mimic walking on a treadmill by temporarily increasing your coronary blood flow.   Please note: these test may take anywhere between 2-4 hours to complete  PLEASE REPORT TO [facility] MEDICAL MALL ENTRANCE  THE VOLUNTEERS AT  THE FIRST DESK WILL DIRECT YOU WHERE TO GO  Date of Procedure:_____________________________________  Arrival Time for Procedure:______________________________  Instructions regarding medication:   ____ : Hold diabetes medication morning of procedure  __x__:  Hold betablocker(s) night before procedure and morning of procedure (metoprolol)  __x__:  Hold other medications as follows:_____HCTZ the morning of______  PLEASE NOTIFY THE OFFICE AT LEAST 24 HOURS IN ADVANCE IF YOU ARE UNABLE TO KEEP YOUR APPOINTMENT.  [phone] AND  PLEASE NOTIFY NUCLEAR MEDICINE AT [facility] AT LEAST 24 HOURS IN ADVANCE IF YOU ARE UNABLE TO KEEP YOUR APPOINTMENT. [phone]  How to prepare for your Myoview test:  1. Do not eat or drink after midnight 2. No caffeine for 24 hours prior to test 3. No smoking 24 hours prior to test. 4. Your medication may be taken with water.  If your doctor stopped a medication because of this test, do not take that medication. 5. Ladies, please do not wear dresses.  Skirts or pants are appropriate. Please wear a short sleeve shirt. 6. No perfume, cologne or lotion. 7. Wear comfortable walking shoes. No heels!     Follow-Up: At [facility], you and your health needs are our priority.  As part of our continuing mission to provide you with exceptional heart care, we have created designated Provider Care Teams.  These Care Teams include your primary Cardiologist (physician) and Advanced Practice Providers (APPs -  Physician Assistants and Nurse Practitioners) who all work together to provide you with the care you need, when you need it.  We recommend signing up for the patient portal called "MyChart".  Sign up information is provided on this  After Visit Summary.  MyChart is used to connect with patients for Virtual Visits (Telemedicine).  Patients are able to view lab/test results, encounter notes, upcoming appointments, etc.  Non-urgent messages can be sent to your provider as well.   To learn more about what you can do with MyChart, go to [email or web address].    Your next appointment:   4-6 week(s)  The format for your next appointment:   In Person  Provider:    You may see Kate Sable, MD or one of the following Advanced Practice Providers on your designated Care Team:    Murray Hodgkins, NP  Christell Faith, PA-C  Marrianne Mood, PA-C       Signed, Kate Sable, MD  11/15/2019 12:10 PM    [place]

## 2019-11-16 LAB — BASIC METABOLIC PANEL
BUN/Creatinine Ratio: 14 (ref 12–28)
BUN: 12 mg/dL (ref 8–27)
CO2: 22 mmol/L (ref 20–29)
Calcium: 9.2 mg/dL (ref 8.7–10.3)
Chloride: 105 mmol/L (ref 96–106)
Creatinine, Ser: 0.83 mg/dL (ref 0.57–1.00)
GFR calc Af Amer: 83 mL/min/{1.73_m2} (ref >59)
GFR calc non Af Amer: 72 mL/min/{1.73_m2} (ref >59)
Glucose: 129 mg/dL — ABNORMAL HIGH (ref 65–99)
Potassium: 4 mmol/L (ref 3.5–5.2)
Sodium: 142 mmol/L (ref 134–144)

## 2019-11-20 ENCOUNTER — Encounter: Payer: Self-pay | Admitting: *Deleted

## 2019-11-28 ENCOUNTER — Other Ambulatory Visit: Payer: Self-pay

## 2019-11-28 ENCOUNTER — Encounter
Admission: RE | Admit: 2019-11-28 | Discharge: 2019-11-28 | Disposition: A | Discharge status: Home / Self Care | Payer: Medicare Other | Source: Ambulatory Visit | Attending: Cardiology | Admitting: Cardiology

## 2019-11-28 DIAGNOSIS — R0602 Shortness of breath: Secondary | ICD-10-CM | POA: Insufficient documentation

## 2019-11-28 LAB — NM MYOCAR MULTI W/SPECT W/WALL MOTION / EF
Estimated workload: 1 METS
Exercise duration (min): 0 min
Exercise duration (sec): 0 s
LV dias vol: 51 mL (ref 46–106)
LV sys vol: 14 mL
MPHR: 151 {beats}/min
Peak HR: 93 {beats}/min
Percent HR: 61 %
Rest HR: 76 {beats}/min
SDS: 4
SRS: 8
SSS: 16
TID: 0.88

## 2019-11-28 MED ORDER — TECHNETIUM TC 99M TETROFOSMIN IV KIT
9.3400 | PACK | Freq: Once | INTRAVENOUS | Status: AC | PRN
  Administered 2019-11-28: 9.34 via INTRAVENOUS

## 2019-11-28 MED ORDER — TECHNETIUM TC 99M TETROFOSMIN IV KIT
31.6560 | PACK | Freq: Once | INTRAVENOUS | Status: AC | PRN
  Administered 2019-11-28: 31.656 via INTRAVENOUS

## 2019-11-28 MED ORDER — REGADENOSON 0.4 MG/5ML IV SOLN
0.4000 mg | Freq: Once | INTRAVENOUS | Status: AC
  Administered 2019-11-28: 0.4 mg via INTRAVENOUS

## 2019-12-03 ENCOUNTER — Ambulatory Visit (INDEPENDENT_AMBULATORY_CARE_PROVIDER_SITE_OTHER): Payer: Medicare Other

## 2019-12-03 ENCOUNTER — Other Ambulatory Visit: Payer: Self-pay

## 2019-12-03 DIAGNOSIS — R0600 Dyspnea, unspecified: Secondary | ICD-10-CM

## 2019-12-03 DIAGNOSIS — R0609 Other forms of dyspnea: Secondary | ICD-10-CM

## 2019-12-26 ENCOUNTER — Other Ambulatory Visit: Payer: Medicare Other

## 2019-12-31 ENCOUNTER — Other Ambulatory Visit: Payer: Self-pay

## 2019-12-31 ENCOUNTER — Ambulatory Visit (INDEPENDENT_AMBULATORY_CARE_PROVIDER_SITE_OTHER): Payer: Medicare Other | Admitting: Cardiology

## 2019-12-31 ENCOUNTER — Encounter: Payer: Self-pay | Admitting: Cardiology

## 2019-12-31 VITALS — BP 130/80 | HR 80 | Ht 66.0 in | Wt 291.1 lb

## 2019-12-31 DIAGNOSIS — R0600 Dyspnea, unspecified: Secondary | ICD-10-CM | POA: Diagnosis not present

## 2019-12-31 DIAGNOSIS — I10 Essential (primary) hypertension: Secondary | ICD-10-CM

## 2019-12-31 DIAGNOSIS — R0609 Other forms of dyspnea: Secondary | ICD-10-CM

## 2019-12-31 NOTE — Progress Notes (Signed)
Cardiology Office Note:    Date:  12/31/2019   ID:  MARCIEL Calhoun, DOB 1951-05-17, MRN [id number]  PCP:  Lebron Quam, MD  Cardiologist:  Debbe Odea, MD  Electrophysiologist:  None   Referring MD: Lebron Quam, MD   Chief Complaint  Patient presents with  . other    4-6 week follow up and discuss Echo & Myoview. Meds reviewed by the pt. verbally. "doing well."     History of Present Illness:    Lindsey Calhoun is a 69 y.o. female with a hx of hypertension, sleep apnea who presents for follow-up.  She was last seen due to 93-month history of shortness of breath .  Also endorses chest discomfort with exertion.  Also endorses roughly 30 pounds weight gain over the past year due to not doing much secondary to COVID-19 pandemic.  Has a brother with a history of MI.  Echocardiogram and Myoview was ordered to evaluate patient's symptoms.  She is doing okay, has lost some weight, walks every day for a couple of miles.  Her symptoms of shortness of breath have also improved since she began walking more frequently.   Past Medical History:  Diagnosis Date  . Bilateral presbyopia 02/01/2018  . Cataract cortical, senile 02/01/2018   Overview:  Bilateral  . Edema    LEFT KNEE AND LEG  . Edema    LEFT LEG/ KNEE  . Hypertension   . Hypertensive retinopathy of both eyes 02/01/2018  . Knee osteoarthritis 02/01/2018  . 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 Moderate obstructive sleep apnea 03/01/2016   Overview:  February 29, 2016 NPSG @ [facility] Sleep Lab overall AHI 24.3/hr;  Supine AHI: 11.7/hr; side AHI: 30.7/hr; REM AHI: 54.6/hr.   . Myopia of both eyes 02/01/2018  . Primary osteoarthritis of both knees 01/19/2016  . PVD (posterior vitreous detachment), right eye 02/01/2018  . Subdural hematoma (HCC)    H/O    Past Surgical History:  Procedure Laterality Date  . BREAST BIOPSY Left 2010  . CATARACT EXTRACTION W/PHACO Left 09/28/2018   Procedure: CATARACT EXTRACTION PHACO AND  INTRAOCULAR LENS PLACEMENT (IOC) LEFT;  Surgeon: Elliot Cousin, MD;  Location: [facility] ORS;  Service: Ophthalmology;  Laterality: Left;  [place] 00:35.0 CDE 4.23 Fluid Pack Lot # [id number]  . CATARACT EXTRACTION W/PHACO Right 11/02/2018   Procedure: CATARACT EXTRACTION PHACO AND INTRAOCULAR LENS PLACEMENT (IOC) RIGHT;  Surgeon: Elliot Cousin, MD;  Location: [facility] ORS;  Service: Ophthalmology;  Laterality: Right;  [place]   01:06 CDE Fluid pack lot # [id number]  . COMBINED LAPAROSCOPY W/ HYSTEROSCOPY    . HERNIA REPAIR    . OOPHORECTOMY    . REPLACEMENT TOTAL KNEE Left 2010    Current Medications: Current Meds  Medication Sig  . fexofenadine (ALLEGRA) 180 MG tablet Take 180 mg by mouth daily.  . hydrochlorothiazide (HYDRODIURIL) 25 MG tablet Take 25 mg by mouth daily.   . meclizine (ANTIVERT) 25 MG tablet Take 25 mg by mouth 3 (three) times daily as needed.  . meloxicam (MOBIC) 15 MG tablet Take 15 mg by mouth daily as needed for pain.  . metoprolol succinate (TOPROL-XL) 25 MG 24 hr tablet Take 25 mg by mouth daily.  . Vitamin D, Ergocalciferol, (DRISDOL) 1.25 MG (50000 UNIT) CAPS capsule Take 50,000 Units by mouth once a week.     Allergies:   Codeine, Oxycontin [oxycodone hcl], and Sulfa antibiotics   Social History   Socioeconomic History  . Marital status: Legally Separated  Spouse name: Not on file  . Number of children: Not on file  . Years of education: Not on file  . Highest education level: Not on file  Occupational History  . Not on file  Tobacco Use  . Smoking status: Never Smoker  . Smokeless tobacco: Never Used  Substance and Sexual Activity  . Alcohol use: Not Currently    Comment: OCCAS  . Drug use: Never  . Sexual activity: Not on file  Other Topics Concern  . Not on file  Social History Narrative  . Not on file   Social Determinants of Health   Financial Resource Strain:   . Difficulty of Paying Living Expenses:   Food Insecurity:   . Worried About Sales executive in the Last Year:   . Arboriculturist in the Last Year:   Transportation Needs:   . Film/video editor (Medical):   Marland Kitchen Lack of Transportation (Non-Medical):   Physical Activity:   . Days of Exercise per Week:   . Minutes of Exercise per Session:   Stress:   . Feeling of Stress :   Social Connections:   . Frequency of Communication with Friends and Family:   . Frequency of Social Gatherings with Friends and Family:   . Attends Religious Services:   . Active Member of Clubs or Organizations:   . Attends Archivist Meetings:   Marland Kitchen Marital Status:      Family History: The patient's family history includes Bladder Cancer in her mother; Heart failure in her father; Stroke in her father. There is no history of Prostate cancer or Kidney cancer.  ROS:   Please see the history of present illness.     All other systems reviewed and are negative.  EKGs/Labs/Other Studies Reviewed:    The following studies were reviewed today:   EKG:  EKG is  ordered today.  The ekg ordered today demonstrates normal sinus rhythm, occasional PVCs.  Recent Labs: 11/15/2019: BUN 12; Creatinine, Ser 0.83; Potassium 4.0; Sodium 142  Recent Lipid Panel No results found for: CHOL, TRIG, HDL, CHOLHDL, VLDL, LDLCALC, LDLDIRECT  Physical Exam:    VS:  BP 130/80 (BP Location: Left Arm, Patient Position: Sitting, Cuff Size: Large)   Pulse 80   Ht 5\' 6"  (1.676 m)   Wt 291 lb 2 oz (132.1 kg)   SpO2 97%   BMI 46.99 kg/m     Wt Readings from Last 3 Encounters:  12/31/19 291 lb 2 oz (132.1 kg)  11/15/19 293 lb 4 oz (133 kg)  11/02/18 260 lb (117.9 kg)     GEN:  Well nourished, well developed in no acute distress, obese HEENT: Normal NECK: No JVD; No carotid bruits LYMPHATICS: No lymphadenopathy CARDIAC: RRR, no murmurs, rubs, gallops RESPIRATORY:  Clear to auscultation without rales, wheezing or rhonchi  ABDOMEN: Soft, non-tender, non-distended MUSCULOSKELETAL:  No edema; No deformity    SKIN: Warm and dry NEUROLOGIC:  Alert and oriented x 3 PSYCHIATRIC:  Normal affect   ASSESSMENT:    1. Dyspnea on exertion   2. Morbid obesity (Adrian)   3. Essential hypertension    PLAN:    In order of problems listed above:  1. Patient with a 52-month history of dyspnea on exertion and chest tightness.  Risk factors of obesity, hypertension.  Echocardiogram showed normal systolic function, EF 55 to 60%, mild LVH, impaired relaxation.  Lexiscan stress test was normal with no evidence for ischemia.  Low risk  study.  Patient reassured.  Her symptoms likely due to deconditioning/being morbidly obese.  Sleep apnea could also be contributing. 2. She is morbidly obese.  Weight loss advised.  She has lost 2 pounds since her last visit.  She was encouraged to continue low calorie diet and exercise as much as he can. 3. History of hypertension, blood pressure is controlled.  Continue current dose of HCTZ.  Follow-up as needed  This note was generated in part or whole with voice recognition software. Voice recognition is usually quite accurate but there are transcription errors that can and very often do occur. I apologize for any typographical errors that were not detected and corrected.  Medication Adjustments/Labs and Tests Ordered: Current medicines are reviewed at length with the patient today.  Concerns regarding medicines are outlined above.  Orders Placed This Encounter  Procedures  . EKG 12-Lead   No orders of the defined types were placed in this encounter.   Patient Instructions  Medication Instructions:  Your physician recommends that you continue on your current medications as directed. Please refer to the Current Medication list given to you today.  *If you need a refill on your cardiac medications before your next appointment, please call your pharmacy*  Follow-Up: At [facility], you and your health needs are our priority.  As part of our continuing mission to provide  you with exceptional heart care, we have created designated Provider Care Teams.  These Care Teams include your primary Cardiologist (physician) and Advanced Practice Providers (APPs -  Physician Assistants and Nurse Practitioners) who all work together to provide you with the care you need, when you need it.  We recommend signing up for the patient portal called "MyChart".  Sign up information is provided on this After Visit Summary.  MyChart is used to connect with patients for Virtual Visits (Telemedicine).  Patients are able to view lab/test results, encounter notes, upcoming appointments, etc.  Non-urgent messages can be sent to your provider as well.   To learn more about what you can do with MyChart, go to [email or web address].    Your next appointment:   As needed.      Signed, Debbe Odea, MD  12/31/2019 11:29 AM    [facility]

## 2019-12-31 NOTE — Patient Instructions (Signed)

## 2020-01-10 ENCOUNTER — Ambulatory Visit: Payer: Medicare Other | Attending: Internal Medicine

## 2020-01-10 DIAGNOSIS — Z20822 Contact with and (suspected) exposure to covid-19: Secondary | ICD-10-CM

## 2020-01-11 LAB — NOVEL CORONAVIRUS, NAA: SARS-CoV-2, NAA: NOT DETECTED

## 2020-01-11 LAB — SARS-COV-2, NAA 2 DAY TAT

## 2020-01-16 ENCOUNTER — Encounter: Payer: Self-pay | Admitting: Emergency Medicine

## 2020-01-16 ENCOUNTER — Other Ambulatory Visit: Payer: Self-pay

## 2020-01-16 ENCOUNTER — Ambulatory Visit
Admission: EM | Admit: 2020-01-16 | Discharge: 2020-01-16 | Disposition: A | Discharge status: Home / Self Care | Payer: Medicare Other | Attending: Family Medicine | Admitting: Family Medicine

## 2020-01-16 DIAGNOSIS — W57XXXA Bitten or stung by nonvenomous insect and other nonvenomous arthropods, initial encounter: Secondary | ICD-10-CM | POA: Diagnosis not present

## 2020-01-16 DIAGNOSIS — R5383 Other fatigue: Secondary | ICD-10-CM | POA: Diagnosis not present

## 2020-01-16 DIAGNOSIS — R6883 Chills (without fever): Secondary | ICD-10-CM | POA: Diagnosis not present

## 2020-01-16 DIAGNOSIS — R509 Fever, unspecified: Secondary | ICD-10-CM

## 2020-01-16 MED ORDER — DOXYCYCLINE HYCLATE 100 MG PO CAPS
100.0000 mg | ORAL_CAPSULE | Freq: Two times a day (BID) | ORAL | 0 refills | Status: DC
Start: 2020-01-16 — End: 2020-08-20

## 2020-01-16 NOTE — ED Triage Notes (Signed)
Pt c/o tick bite on her right upper arm. She states she noticed 12/23/19 and removed the tick. She had fever (101) and chills about 5 days ago but has since resolved. She states she was tested for covid when this occurred and was negative. Pt is concerned about tick bourne illness.

## 2020-01-16 NOTE — Discharge Instructions (Addendum)
I have sent in Doxycycline 100mg  twice daily for the next 7 days.  If your bloodwork comes back positive, we will increase the length of treatment to 21 days.  Your results will be available via MyChart if they are negative. If your results are positive, we will be in contact with you.

## 2020-01-16 NOTE — ED Provider Notes (Signed)
[facility]   [id number] 01/16/20 Arrival Time: 0829  CC: RASH  SUBJECTIVE:  Lindsey Calhoun is a 69 y.o. female who presents with a skin complaint that began on 12/23/19.  Reports tick bite that day.  Denies changes in soaps, detergents, close contacts with similar rash, known trigger or environmental trigger, allergy. Denies medications change or starting a new medication recently.  Localizes the rash to R deltoid.  Describes it as itchy.  Has not attempted to treat this at home.  Symptoms are made worse with nothing. Denies similar symptoms in the past. Reports fever, chills, fatigue.  Denies nausea, vomiting, erythema, swelling, discharge, oral lesions, SOB, chest pain, abdominal pain, changes in bowel or bladder function.    ROS: As per HPI.  All other pertinent ROS negative.     Past Medical History:  Diagnosis Date  . Bilateral presbyopia 02/01/2018  . Cataract cortical, senile 02/01/2018   Overview:  Bilateral  . Edema    LEFT KNEE AND LEG  . Edema    LEFT LEG/ KNEE  . Hypertension   . Hypertensive retinopathy of both eyes 02/01/2018  . Knee osteoarthritis 02/01/2018  . 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 Moderate obstructive sleep apnea 03/01/2016   Overview:  February 29, 2016 NPSG @ [facility] Sleep Lab overall AHI 24.3/hr;  Supine AHI: 11.7/hr; side AHI: 30.7/hr; REM AHI: 54.6/hr.   . Myopia of both eyes 02/01/2018  . Primary osteoarthritis of both knees 01/19/2016  . PVD (posterior vitreous detachment), right eye 02/01/2018  . Subdural hematoma (HCC)    H/O   Past Surgical History:  Procedure Laterality Date  . BREAST BIOPSY Left 2010  . CATARACT EXTRACTION W/PHACO Left 09/28/2018   Procedure: CATARACT EXTRACTION PHACO AND INTRAOCULAR LENS PLACEMENT (IOC) LEFT;  Surgeon: Elliot Cousin, MD;  Location: [facility] ORS;  Service: Ophthalmology;  Laterality: Left;  [place] 00:35.0 CDE 4.23 Fluid Pack Lot # [id number]  . CATARACT EXTRACTION W/PHACO Right 11/02/2018   Procedure:  CATARACT EXTRACTION PHACO AND INTRAOCULAR LENS PLACEMENT (IOC) RIGHT;  Surgeon: Elliot Cousin, MD;  Location: [facility] ORS;  Service: Ophthalmology;  Laterality: Right;  [place]   01:06 CDE Fluid pack lot # [id number]  . COMBINED LAPAROSCOPY W/ HYSTEROSCOPY    . HERNIA REPAIR    . OOPHORECTOMY    . REPLACEMENT TOTAL KNEE Left 2010   Allergies  Allergen Reactions  . Codeine Hives  . Oxycontin [Oxycodone Hcl] Hives  . Sulfa Antibiotics Hives   No current facility-administered medications on file prior to encounter.   Current Outpatient Medications on File Prior to Encounter  Medication Sig Dispense Refill  . fexofenadine (ALLEGRA) 180 MG tablet Take 180 mg by mouth daily.    . hydrochlorothiazide (HYDRODIURIL) 25 MG tablet Take 25 mg by mouth daily.     . meclizine (ANTIVERT) 25 MG tablet Take 25 mg by mouth 3 (three) times daily as needed.    . meloxicam (MOBIC) 15 MG tablet Take 15 mg by mouth daily as needed for pain.    . metoprolol succinate (TOPROL-XL) 25 MG 24 hr tablet Take 25 mg by mouth daily.    . Vitamin D, Ergocalciferol, (DRISDOL) 1.25 MG (50000 UNIT) CAPS capsule Take 50,000 Units by mouth once a week.     Social History   Socioeconomic History  . Marital status: Legally Separated    Spouse name: Not on file  . Number of children: Not on file  . Years of education: Not on file  . Highest  education level: Not on file  Occupational History  . Not on file  Tobacco Use  . Smoking status: Never Smoker  . Smokeless tobacco: Never Used  Substance and Sexual Activity  . Alcohol use: Not Currently    Comment: OCCAS  . Drug use: Never  . Sexual activity: Not on file  Other Topics Concern  . Not on file  Social History Narrative  . Not on file   Social Determinants of Health   Financial Resource Strain:   . Difficulty of Paying Living Expenses:   Food Insecurity:   . Worried About Programme researcher, broadcasting/film/video in the Last Year:   . Barista in the Last Year:     Transportation Needs:   . Freight forwarder (Medical):   Marland Kitchen Lack of Transportation (Non-Medical):   Physical Activity:   . Days of Exercise per Week:   . Minutes of Exercise per Session:   Stress:   . Feeling of Stress :   Social Connections:   . Frequency of Communication with Friends and Family:   . Frequency of Social Gatherings with Friends and Family:   . Attends Religious Services:   . Active Member of Clubs or Organizations:   . Attends Banker Meetings:   Marland Kitchen Marital Status:   Intimate Partner Violence:   . Fear of Current or Ex-Partner:   . Emotionally Abused:   Marland Kitchen Physically Abused:   . Sexually Abused:    Family History  Problem Relation Age of Onset  . Bladder Cancer Mother   . Stroke Father   . Heart failure Father   . Prostate cancer Neg Hx   . Kidney cancer Neg Hx     OBJECTIVE: Vitals:   01/16/20 0833  BP: 139/83  Pulse: 75  Resp: 18  Temp: 98.8 F (37.1 C)  TempSrc: Oral  SpO2: 96%  Weight: 291 lb 3.6 oz (132.1 kg)  Height: 5\' 6"  (1.676 m)    General appearance: alert; no distress Head: NCAT Lungs: clear to auscultation bilaterally Heart: regular rate and rhythm.  Radial pulse 2+ bilaterally Extremities: no edema Skin: warm and dry; Erythematous circular area to R deltoid about 1/2cm in diameter. Psychological: alert and cooperative; normal mood and affect  ASSESSMENT & PLAN:  1. Fever, unspecified fever cause   2. Chills   3. Tick bite, initial encounter   4. Other fatigue     Meds ordered this encounter  Medications  . doxycycline (VIBRAMYCIN) 100 MG capsule    Sig: Take 1 capsule (100 mg total) by mouth 2 (two) times daily.    Dispense:  14 capsule    Refill:  0    Order Specific Question:   Supervising Provider    Answer:   Merrilee Jansky      Prescribed doxycycline 100mg  BID x 7 days.  Take as prescribed and to completion Avoid hot showers/ baths Checking labs for lyme disease, RMSF, CBC,  CMP. Instructed pt that if results are positive we will extend treatment to 21 days.  Moisturize skin daily  Follow up with PCP if symptoms persists Return or go to the ER if you have any new or worsening symptoms such as fever, chills, nausea, vomiting, redness, swelling, discharge, if symptoms do not improve with medications, etc...  Reviewed expectations re: course of current medical issues. Questions answered. Outlined signs and symptoms indicating need for more acute intervention. Patient verbalized understanding. After Visit Summary given.  Faustino Congress, NP 01/16/20 [phone]

## 2020-01-18 LAB — CBC WITH DIFFERENTIAL/PLATELET
Basophils Absolute: 0 10*3/uL (ref 0.0–0.2)
Basos: 0 %
EOS (ABSOLUTE): 0.3 10*3/uL (ref 0.0–0.4)
Eos: 3 %
Hematocrit: 43.7 % (ref 34.0–46.6)
Hemoglobin: 14.2 g/dL (ref 11.1–15.9)
Immature Grans (Abs): 0 10*3/uL (ref 0.0–0.1)
Immature Granulocytes: 0 %
Lymphocytes Absolute: 2.5 10*3/uL (ref 0.7–3.1)
Lymphs: 26 %
MCH: 26.8 pg (ref 26.6–33.0)
MCHC: 32.5 g/dL (ref 31.5–35.7)
MCV: 83 fL (ref 79–97)
Monocytes Absolute: 0.8 10*3/uL (ref 0.1–0.9)
Monocytes: 8 %
Neutrophils Absolute: 5.8 10*3/uL (ref 1.4–7.0)
Neutrophils: 63 %
Platelets: 329 10*3/uL (ref 150–450)
RBC: 5.3 x10E6/uL — ABNORMAL HIGH (ref 3.77–5.28)
RDW: 13.4 % (ref 11.7–15.4)
WBC: 9.4 10*3/uL (ref 3.4–10.8)

## 2020-01-18 LAB — COMPREHENSIVE METABOLIC PANEL
ALT: 20 IU/L (ref 0–32)
AST: 17 IU/L (ref 0–40)
Albumin/Globulin Ratio: 1.6 (ref 1.2–2.2)
Albumin: 4.3 g/dL (ref 3.8–4.8)
Alkaline Phosphatase: 94 IU/L (ref 39–117)
BUN/Creatinine Ratio: 28 (ref 12–28)
BUN: 18 mg/dL (ref 8–27)
Bilirubin Total: 0.5 mg/dL (ref 0.0–1.2)
CO2: 22 mmol/L (ref 20–29)
Calcium: 9.3 mg/dL (ref 8.7–10.3)
Chloride: 104 mmol/L (ref 96–106)
Creatinine, Ser: 0.64 mg/dL (ref 0.57–1.00)
GFR calc Af Amer: 105 mL/min/{1.73_m2} (ref >59)
GFR calc non Af Amer: 91 mL/min/{1.73_m2} (ref >59)
Globulin, Total: 2.7 g/dL (ref 1.5–4.5)
Glucose: 120 mg/dL — ABNORMAL HIGH (ref 65–99)
Potassium: 4 mmol/L (ref 3.5–5.2)
Sodium: 141 mmol/L (ref 134–144)
Total Protein: 7 g/dL (ref 6.0–8.5)

## 2020-01-18 LAB — B. BURGDORFI ANTIBODIES: Lyme IgG/IgM Ab: <0.91 {ISR} (ref 0.00–0.90)

## 2020-01-18 LAB — ROCKY MTN SPOTTED FVR ABS PNL(IGG+IGM)
RMSF IgG: NEGATIVE
RMSF IgM: 0.31 index (ref 0.00–0.89)

## 2020-08-20 ENCOUNTER — Encounter: Payer: Self-pay | Admitting: Podiatry

## 2020-08-20 ENCOUNTER — Ambulatory Visit: Payer: Medicare Other

## 2020-08-20 ENCOUNTER — Other Ambulatory Visit: Payer: Self-pay

## 2020-08-20 ENCOUNTER — Ambulatory Visit (INDEPENDENT_AMBULATORY_CARE_PROVIDER_SITE_OTHER): Payer: Medicare Other | Admitting: Podiatry

## 2020-08-20 DIAGNOSIS — L603 Nail dystrophy: Secondary | ICD-10-CM

## 2020-08-20 DIAGNOSIS — S99921A Unspecified injury of right foot, initial encounter: Secondary | ICD-10-CM

## 2020-08-20 DIAGNOSIS — L600 Ingrowing nail: Secondary | ICD-10-CM

## 2020-08-20 NOTE — Progress Notes (Signed)
  Subjective:  Patient ID: Lindsey Calhoun, female    DOB: 19-Nov-1950,  MRN: [id number]  Chief Complaint  Patient presents with  . Nail Problem    Patient dropped can on right hallux about 6 weeks ago, now nail is growing sharp, feels like could be an ingrown, tender around nail bed.    69 y.o. female presents with the above complaint. History confirmed with patient. Hx of nail avulsion 2005 with matrixectomy  Objective:  Physical Exam: warm, good capillary refill, no trophic changes or ulcerative lesions, normal DP and PT pulses and normal sensory exam. Left Foot: hallux nail previously removed, some regrowth, dystrophic and ingrown corner of nail bed  Assessment:   1. Injury of great toe, right, initial encounter   2. Ingrowing left great toenail   3. Nail dystrophy      Plan:  Patient was evaluated and treated and all questions answered.  Debrided offending nail border and removed with tissue nipper and nail curette. Recommend she continue to monitor this, use lotion PRN, should alleviate pain and pressure. Could repeat matrixectomy in future if still bothersome.  Return if symptoms worsen or fail to improve.

## 2020-10-27 ENCOUNTER — Other Ambulatory Visit: Payer: Self-pay | Admitting: Family Medicine

## 2020-10-27 ENCOUNTER — Other Ambulatory Visit: Payer: Self-pay | Admitting: Chiropractic Medicine

## 2020-10-27 DIAGNOSIS — Z1231 Encounter for screening mammogram for malignant neoplasm of breast: Secondary | ICD-10-CM

## 2020-11-06 ENCOUNTER — Other Ambulatory Visit: Payer: Self-pay

## 2020-11-06 ENCOUNTER — Ambulatory Visit
Admission: RE | Admit: 2020-11-06 | Discharge: 2020-11-06 | Disposition: A | Discharge status: Home / Self Care | Payer: Medicare Other | Source: Ambulatory Visit | Attending: Family Medicine | Admitting: Family Medicine

## 2020-11-06 DIAGNOSIS — Z1231 Encounter for screening mammogram for malignant neoplasm of breast: Secondary | ICD-10-CM | POA: Diagnosis not present

## 2020-11-11 ENCOUNTER — Other Ambulatory Visit: Payer: Self-pay | Admitting: Family Medicine

## 2020-11-13 ENCOUNTER — Other Ambulatory Visit: Payer: Self-pay | Admitting: Family Medicine

## 2020-11-13 DIAGNOSIS — R928 Other abnormal and inconclusive findings on diagnostic imaging of breast: Secondary | ICD-10-CM

## 2020-11-13 DIAGNOSIS — N6320 Unspecified lump in the left breast, unspecified quadrant: Secondary | ICD-10-CM

## 2020-11-13 DIAGNOSIS — N6310 Unspecified lump in the right breast, unspecified quadrant: Secondary | ICD-10-CM

## 2020-11-20 ENCOUNTER — Ambulatory Visit
Admission: RE | Admit: 2020-11-20 | Discharge: 2020-11-20 | Disposition: A | Discharge status: Home / Self Care | Payer: Medicare Other | Source: Ambulatory Visit | Attending: Family Medicine | Admitting: Family Medicine

## 2020-11-20 ENCOUNTER — Other Ambulatory Visit: Payer: Self-pay

## 2020-11-20 DIAGNOSIS — N6320 Unspecified lump in the left breast, unspecified quadrant: Secondary | ICD-10-CM

## 2020-11-20 DIAGNOSIS — N6310 Unspecified lump in the right breast, unspecified quadrant: Secondary | ICD-10-CM | POA: Insufficient documentation

## 2020-11-20 DIAGNOSIS — R928 Other abnormal and inconclusive findings on diagnostic imaging of breast: Secondary | ICD-10-CM

## 2021-06-05 ENCOUNTER — Encounter: Payer: Self-pay | Admitting: Podiatry

## 2021-06-05 ENCOUNTER — Other Ambulatory Visit: Payer: Self-pay

## 2021-06-05 ENCOUNTER — Ambulatory Visit (INDEPENDENT_AMBULATORY_CARE_PROVIDER_SITE_OTHER): Payer: Medicare Other | Admitting: Podiatry

## 2021-06-05 DIAGNOSIS — L603 Nail dystrophy: Secondary | ICD-10-CM

## 2021-06-05 NOTE — Progress Notes (Signed)
   HPI: 70 y.o. female presenting today for evaluation of right toenail that is very symptomatic.  Patient states that she had total nail avulsion procedures performed about 20 years ago however the right toenail regrew.  She would eventually like to have total permanent nail matricectomy performed again to the right great toe but she is going on a road trip next week to [place] and would like simple debridement of the right hallux nail plate.  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    Myopia of both eyes 02/01/2018   Primary osteoarthritis of both knees 01/19/2016   PVD (posterior vitreous detachment), right eye 02/01/2018   Subdural hematoma (HCC)    H/O     Physical Exam: General: The patient is alert and oriented x3 in no acute distress.  Dermatology: Skin is warm, dry and supple bilateral lower extremities. Negative for open lesions or macerations.  Dystrophic nail noted to the right hallux nail plate  Vascular: Palpable pedal pulses bilaterally. No edema or erythema noted. Capillary refill within normal limits.  Neurological: Epicritic and protective threshold grossly intact bilaterally.   Musculoskeletal Exam: No pedal deformity noted  Assessment: 1.  Dystrophic nail right hallux nail plate   Plan of Care:  1. Patient evaluated. 2.  Light debridement of the right hallux nail plate was performed using a nail nipper without incident or bleeding and use Dremel was used  to smooth the nail.  Patient felt relief. 3.  Return to clinic as needed when she is ready to have a total permanent nail avulsion of the right hallux nail plate      Felecia Shelling, DPM [facility]  Dr. Felecia Shelling, DPM    [address] [place], [place] [address]                Office [phone]  Fax [phone]

## 2021-10-26 ENCOUNTER — Other Ambulatory Visit: Payer: Self-pay | Admitting: Family Medicine

## 2021-10-26 DIAGNOSIS — Z1231 Encounter for screening mammogram for malignant neoplasm of breast: Secondary | ICD-10-CM

## 2021-11-23 ENCOUNTER — Ambulatory Visit
Admission: RE | Admit: 2021-11-23 | Discharge: 2021-11-23 | Disposition: A | Discharge status: Home / Self Care | Payer: Medicare Other | Source: Ambulatory Visit | Attending: Family Medicine | Admitting: Family Medicine

## 2021-11-23 ENCOUNTER — Other Ambulatory Visit: Payer: Self-pay

## 2021-11-23 DIAGNOSIS — Z1231 Encounter for screening mammogram for malignant neoplasm of breast: Secondary | ICD-10-CM | POA: Diagnosis present

## 2022-05-25 ENCOUNTER — Ambulatory Visit (INDEPENDENT_AMBULATORY_CARE_PROVIDER_SITE_OTHER): Payer: Medicare Other | Admitting: Podiatry

## 2022-05-25 DIAGNOSIS — L603 Nail dystrophy: Secondary | ICD-10-CM | POA: Diagnosis not present

## 2022-05-25 NOTE — Progress Notes (Signed)
   HPI: 71 y.o. female presenting today for follow-up evaluation of right toenail that is very symptomatic.  Patient states that she had total nail avulsion procedures performed about 20 years ago however the right toenail regrew.  She was last seen in the in the office about 1 year ago and simple debridement was performed.  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    Myopia of both eyes 02/01/2018   Primary osteoarthritis of both knees 01/19/2016   PVD (posterior vitreous detachment), right eye 02/01/2018   Subdural hematoma    H/O     Physical Exam: General: The patient is alert and oriented x3 in no acute distress.  Dermatology: Skin is warm, dry and supple bilateral lower extremities. Negative for open lesions or macerations.  Dystrophic nail noted to the right hallux nail plate  Vascular: Palpable pedal pulses bilaterally. No edema or erythema noted. Capillary refill within normal limits.  Neurological: Epicritic and protective threshold grossly intact bilaterally.   Musculoskeletal Exam: No pedal deformity noted  Assessment: 1.  Dystrophic nail right hallux nail plate 2. H/o total permanent nail avulsion right hallux 20+ years ago  Plan of Care:  1. Patient evaluated. 2.  Light debridement of the right hallux nail plate was performed using a nail nipper without incident or bleeding and use Dremel was used to smooth the nail.   Patient felt relief. 3.  Return to clinic as needed.  Patient had about 1 years worth of relief with simple debridement.  I would recommend continuing conservative treatment     Felecia Shelling, DPM [facility]  Dr. Felecia Shelling, DPM    [address] [place], [place] [address]                Office [phone]  Fax [phone]

## 2022-10-18 ENCOUNTER — Other Ambulatory Visit: Payer: Self-pay | Admitting: Family Medicine

## 2022-10-18 DIAGNOSIS — Z1231 Encounter for screening mammogram for malignant neoplasm of breast: Secondary | ICD-10-CM

## 2022-12-29 ENCOUNTER — Ambulatory Visit
Admission: RE | Admit: 2022-12-29 | Discharge: 2022-12-29 | Disposition: A | Discharge status: Home / Self Care | Payer: Medicare Other | Source: Ambulatory Visit | Attending: Family Medicine | Admitting: Family Medicine

## 2022-12-29 DIAGNOSIS — Z1231 Encounter for screening mammogram for malignant neoplasm of breast: Secondary | ICD-10-CM | POA: Insufficient documentation

## 2023-03-29 ENCOUNTER — Ambulatory Visit (INDEPENDENT_AMBULATORY_CARE_PROVIDER_SITE_OTHER): Payer: Medicare Other | Admitting: Podiatry

## 2023-03-29 ENCOUNTER — Encounter: Payer: Self-pay | Admitting: Podiatry

## 2023-03-29 VITALS — BP 148/81 | HR 78

## 2023-03-29 DIAGNOSIS — L603 Nail dystrophy: Secondary | ICD-10-CM | POA: Diagnosis not present

## 2023-03-29 NOTE — Progress Notes (Signed)
   HPI: 72 y.o. female presenting today for follow-up evaluation of right toenail that is very symptomatic.  Patient states that she had total nail avulsion procedures performed about 20 years ago however the right toenail regrew.  She was last seen in the in the office about 1 year ago and simple debridement was performed.  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    Myopia of both eyes 02/01/2018   Primary osteoarthritis of both knees 01/19/2016   PVD (posterior vitreous detachment), right eye 02/01/2018   Subdural hematoma (HCC)    H/O     Physical Exam: General: The patient is alert and oriented x3 in no acute distress.  Dermatology: Skin is warm, dry and supple bilateral lower extremities. Negative for open lesions or macerations.  Dystrophic nail noted to the right hallux nail plate  Vascular: Palpable pedal pulses bilaterally. No edema or erythema noted. Capillary refill within normal limits.  Neurological: Epicritic and protective threshold grossly intact bilaterally.   Musculoskeletal Exam: No pedal deformity noted  Assessment: 1.  Dystrophic nail right hallux nail plate 2. H/o total permanent nail avulsion right hallux 20+ years ago  Plan of Care:  1. Patient evaluated. 2.  Light debridement of the right hallux nail plate was performed using a nail nipper without incident or bleeding and use Dremel was used to smooth the  nail.  Patient felt relief. 3.  Return to clinic as needed.  Patient had about 1 years worth of relief with simple debridement.  I would recommend continuing conservative treatment     Felecia Shelling, DPM [facility]  Dr. Felecia Shelling, DPM    [address] [place], [place] [address]                Office [phone]  Fax [phone]

## 2023-10-03 IMAGING — MG MM DIGITAL SCREENING BILAT W/ TOMO AND CAD
6 of 10 series · 6 of 30 positions shown · non-contrast
Comparison: Previous exam(s).

CLINICAL DATA: Screening.

EXAM:
DIGITAL SCREENING BILATERAL MAMMOGRAM WITH TOMOSYNTHESIS AND CAD
TECHNIQUE: Bilateral screening digital craniocaudal and mediolateral oblique
mammograms were obtained. Bilateral screening digital breast
tomosynthesis was performed. The images were evaluated with
computer-aided detection.

[L CC synth-2D]
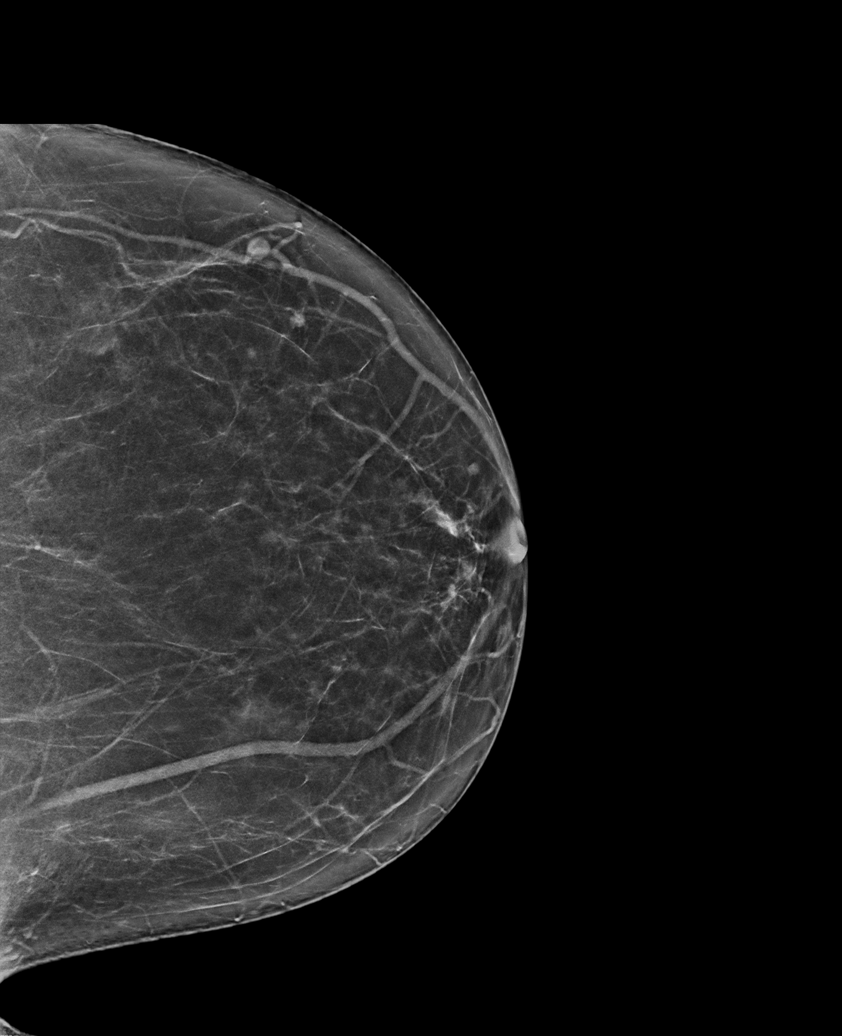

[R MLO synth-2D (1 of 2)]
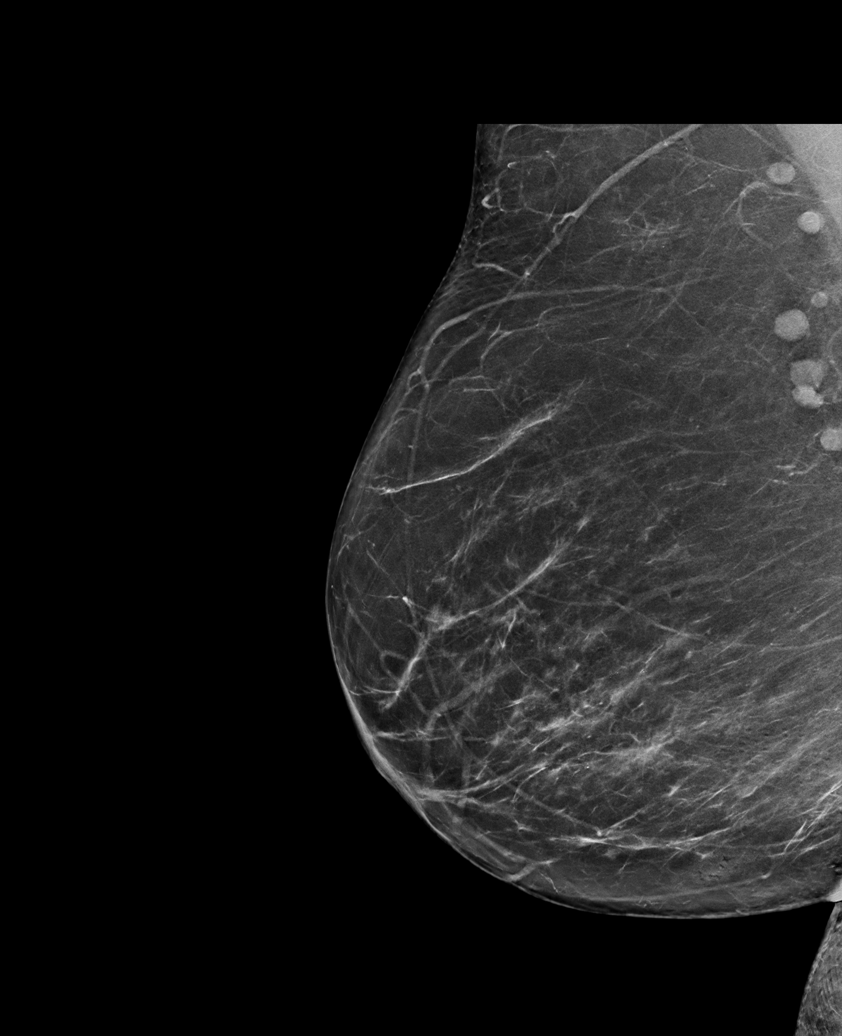

[L MLO synth-2D]
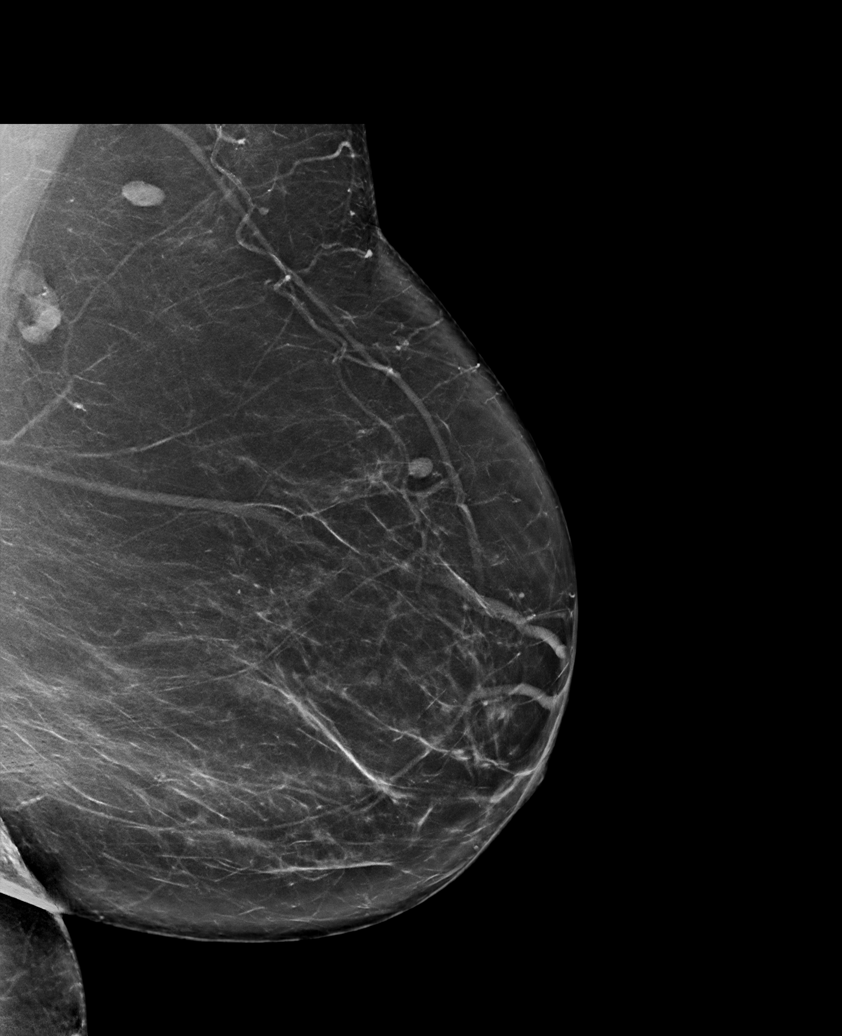

[R CC synth-2D]
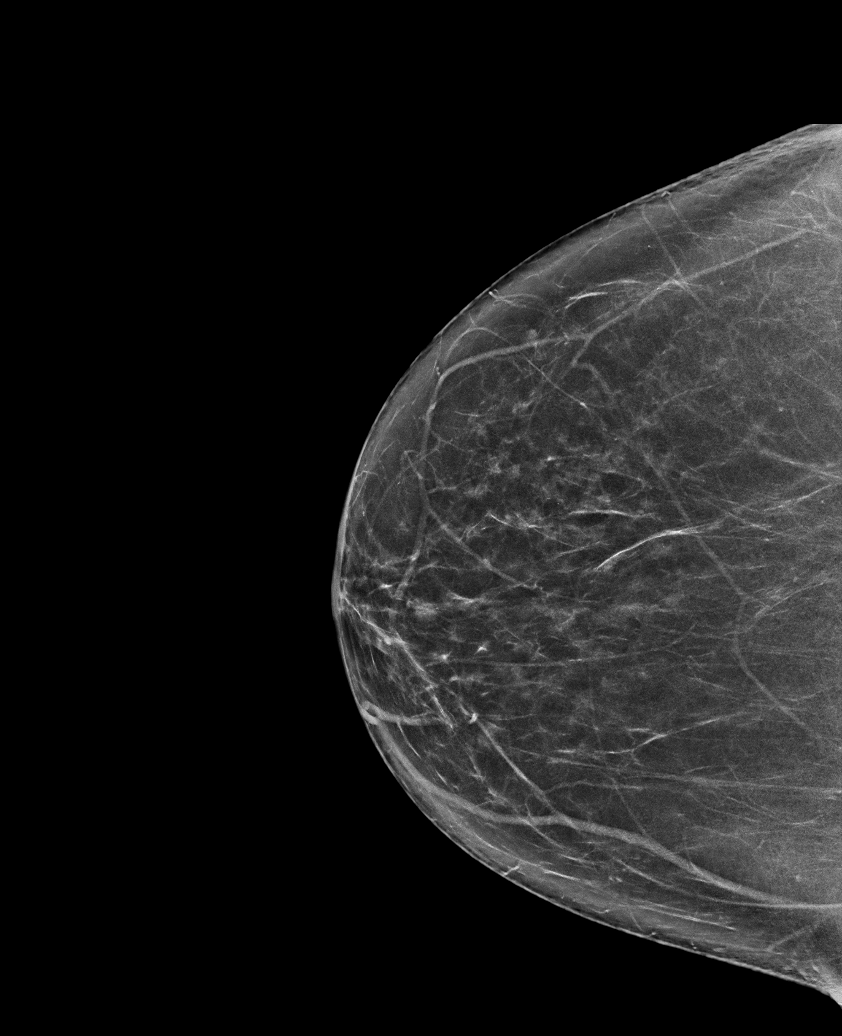

[R MLO synth-2D (2 of 2)]
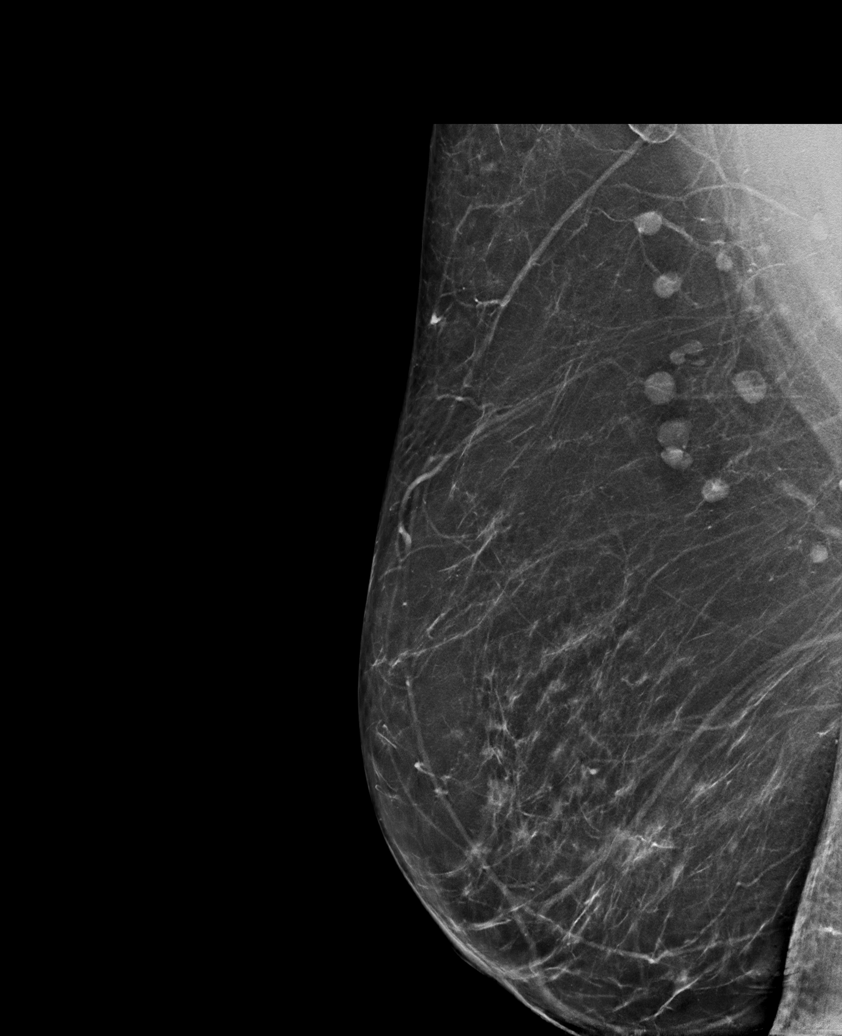

[R MLO tomo · tomo slice 40/79.0]
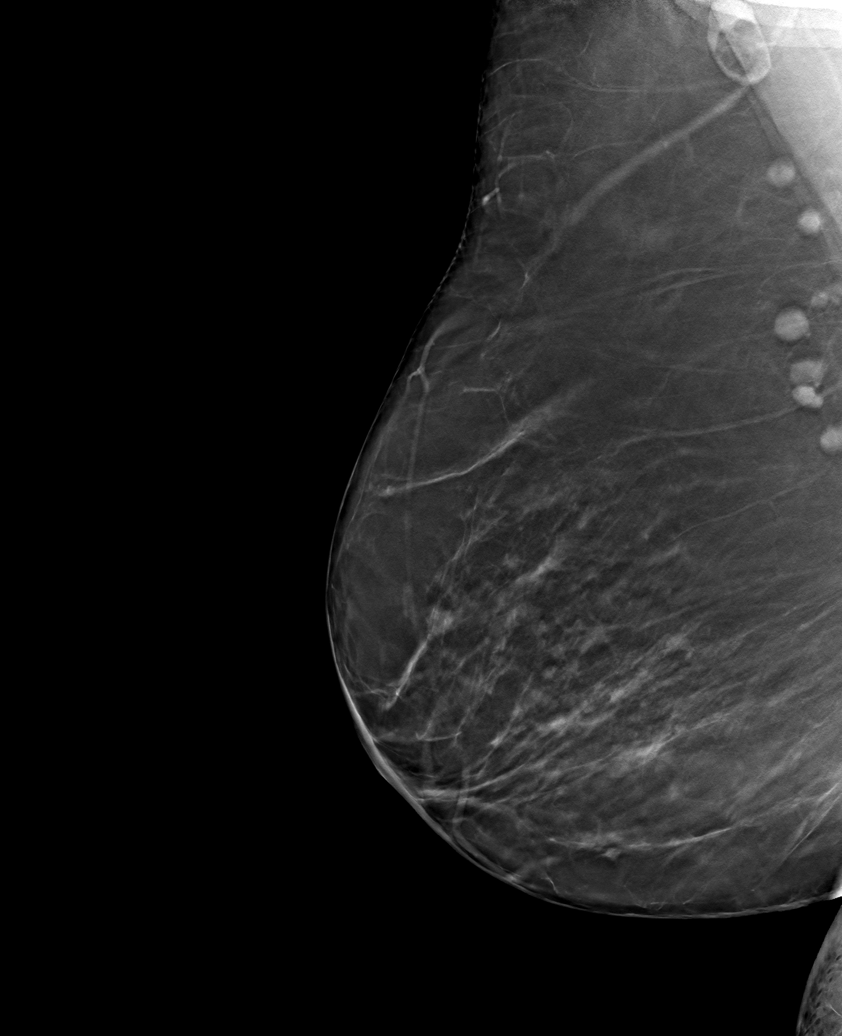

[6 of 30 positions shown; findings below may reference images not displayed]

ACR Breast Density Category b: There are scattered areas of
fibroglandular density.
FINDINGS: There are no findings suspicious for malignancy.
IMPRESSION: No mammographic evidence of malignancy. A result letter of this
screening mammogram will be mailed directly to the patient.

RECOMMENDATION:
Screening mammogram in one year. (Code:51-O-LD2)

BI-RADS CATEGORY  1: Negative.

## 2023-11-09 ENCOUNTER — Other Ambulatory Visit: Payer: Self-pay | Admitting: Family Medicine

## 2023-11-09 DIAGNOSIS — Z1231 Encounter for screening mammogram for malignant neoplasm of breast: Secondary | ICD-10-CM

## 2024-01-04 ENCOUNTER — Ambulatory Visit
Admission: RE | Admit: 2024-01-04 | Discharge: 2024-01-04 | Disposition: A | Discharge status: Home / Self Care | Source: Ambulatory Visit | Attending: Family Medicine | Admitting: Family Medicine

## 2024-01-04 DIAGNOSIS — Z1231 Encounter for screening mammogram for malignant neoplasm of breast: Secondary | ICD-10-CM | POA: Diagnosis present

## 2024-01-20 ENCOUNTER — Ambulatory Visit (INDEPENDENT_AMBULATORY_CARE_PROVIDER_SITE_OTHER): Admitting: Podiatry

## 2024-01-20 ENCOUNTER — Encounter: Payer: Self-pay | Admitting: Podiatry

## 2024-01-20 DIAGNOSIS — B351 Tinea unguium: Secondary | ICD-10-CM | POA: Diagnosis not present

## 2024-01-20 DIAGNOSIS — M79675 Pain in left toe(s): Secondary | ICD-10-CM | POA: Diagnosis not present

## 2024-01-20 DIAGNOSIS — M79674 Pain in right toe(s): Secondary | ICD-10-CM | POA: Diagnosis not present

## 2024-01-20 NOTE — Progress Notes (Signed)
   Chief Complaint  Patient presents with   Nail Problem    "He has to use the dremel on my right big toenail.  The left one has started to grow back too."    SUBJECTIVE Patient presents to office today complaining of elongated, thickened nails that cause pain while ambulating in shoes.  Patient is unable to trim their own nails. Patient is here for further evaluation and treatment.  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    Myopia of both eyes 02/01/2018   Primary osteoarthritis of both knees 01/19/2016   PVD (posterior vitreous detachment), right eye 02/01/2018   Subdural hematoma (HCC)    H/O    Allergies  Allergen Reactions   Codeine Hives   Oxycontin [Oxycodone Hcl] Hives   Sulfa Antibiotics Hives     OBJECTIVE General Patient is awake, alert, and oriented x 3 and in no acute distress. Derm Skin is dry and supple bilateral. Negative open lesions or macerations. Remaining integument unremarkable. Nails are tender, long, thickened and dystrophic with subungual debris, consistent with onychomycosis, 1-5 bilateral. No signs of infection noted. Vasc  DP and PT pedal pulses palpable bilaterally. Temperature gradient within normal limits.  Neuro grossly intact via light touch Musculoskeletal Exam No symptomatic pedal deformities noted bilateral. Muscular strength within normal limits.  ASSESSMENT 1.  Pain due to onychomycosis of toenails both 2.  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 Patient evaluated today.  2. Instructed to maintain good pedal hygiene and foot care.  3. Mechanical debridement of nails 1-5 bilaterally performed using a nail nipper. Filed with dremel without incident.  4. Return to clinic in 3 mos.    Dot Gazella, DPM [facility]  Dr. Dot Gazella, DPM    [address] [place], [place] [address]                Office [phone]  Fax [phone]
